# Patient Record
Sex: Male | Born: 1955 | Race: White | Hispanic: No | Marital: Married | Smoking: Former smoker
Health system: Southern US, Community
[De-identification: ages and names within clinical notes are randomized; demographics above are authoritative.]

## PROBLEM LIST (undated history)

## (undated) DIAGNOSIS — F418 Other specified anxiety disorders: Secondary | ICD-10-CM

## (undated) DIAGNOSIS — S069X9A Unspecified intracranial injury with loss of consciousness of unspecified duration, initial encounter: Secondary | ICD-10-CM

## (undated) DIAGNOSIS — C61 Malignant neoplasm of prostate: Secondary | ICD-10-CM

## (undated) DIAGNOSIS — S069XAA Unspecified intracranial injury with loss of consciousness status unknown, initial encounter: Secondary | ICD-10-CM

## (undated) DIAGNOSIS — I10 Essential (primary) hypertension: Secondary | ICD-10-CM

## (undated) DIAGNOSIS — F32A Depression, unspecified: Secondary | ICD-10-CM

## (undated) DIAGNOSIS — F329 Major depressive disorder, single episode, unspecified: Secondary | ICD-10-CM

## (undated) DIAGNOSIS — G9332 Myalgic encephalomyelitis/chronic fatigue syndrome: Secondary | ICD-10-CM

## (undated) DIAGNOSIS — Z8546 Personal history of malignant neoplasm of prostate: Secondary | ICD-10-CM

## (undated) DIAGNOSIS — R5382 Chronic fatigue, unspecified: Secondary | ICD-10-CM

## (undated) HISTORY — PX: CATARACT EXTRACTION: SUR2

## (undated) HISTORY — DX: Malignant neoplasm of prostate: C61

## (undated) HISTORY — DX: Other specified anxiety disorders: F41.8

## (undated) HISTORY — DX: Unspecified intracranial injury with loss of consciousness status unknown, initial encounter: S06.9XAA

## (undated) HISTORY — PX: EYE SURGERY: SHX253

## (undated) HISTORY — PX: COLONOSCOPY: SHX174

## (undated) HISTORY — DX: Unspecified intracranial injury with loss of consciousness of unspecified duration, initial encounter: S06.9X9A

## (undated) HISTORY — DX: Essential (primary) hypertension: I10

## (undated) HISTORY — DX: Depression, unspecified: F32.A

## (undated) HISTORY — DX: Personal history of malignant neoplasm of prostate: Z85.46

## (undated) HISTORY — PX: HX WISDOM TEETH EXTRACTION: SHX21

## (undated) HISTORY — PX: HX CATARACT REMOVAL: SHX102

## (undated) HISTORY — PX: HX TONSIL AND ADENOIDECTOMY: SHX28

## (undated) HISTORY — PX: HX TONSILLECTOMY: SHX27

## (undated) HISTORY — PX: HX NOSE/SINUS SURGERY: 2100001179

---
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
## 2005-03-15 ENCOUNTER — Emergency Department (HOSPITAL_COMMUNITY): Admission: EM | Admit: 2005-03-15 | Discharge: 2005-03-15 | Payer: Self-pay | Admitting: *Deleted

## 2005-03-17 ENCOUNTER — Emergency Department (HOSPITAL_COMMUNITY): Admission: EM | Admit: 2005-03-17 | Discharge: 2005-03-17 | Payer: Self-pay | Admitting: Emergency Medicine

## 2005-12-05 ENCOUNTER — Ambulatory Visit (HOSPITAL_COMMUNITY): Admission: RE | Admit: 2005-12-05 | Discharge: 2005-12-05 | Payer: Self-pay | Admitting: Family Medicine

## 2006-11-19 ENCOUNTER — Emergency Department (HOSPITAL_COMMUNITY): Admission: EM | Admit: 2006-11-19 | Discharge: 2006-11-19 | Payer: Self-pay | Admitting: Emergency Medicine

## 2007-06-27 IMAGING — CR DG ANKLE COMPLETE 3+V*R*
3 series · 3 of 3 positions shown · non-contrast
Comparison: none

HISTORY: Injury, fall, pain and swelling

RIGHT ANKLE 3 VIEWS:
Lateral soft tissue swelling.
Ankle mortise intact.
Amorphous calcification seen in soft tissues at medial ankle, question minimal
calcification within deltoid ligament.
No acute fracture, dislocation, or bone destruction.

[view not recorded (1 of 3)]
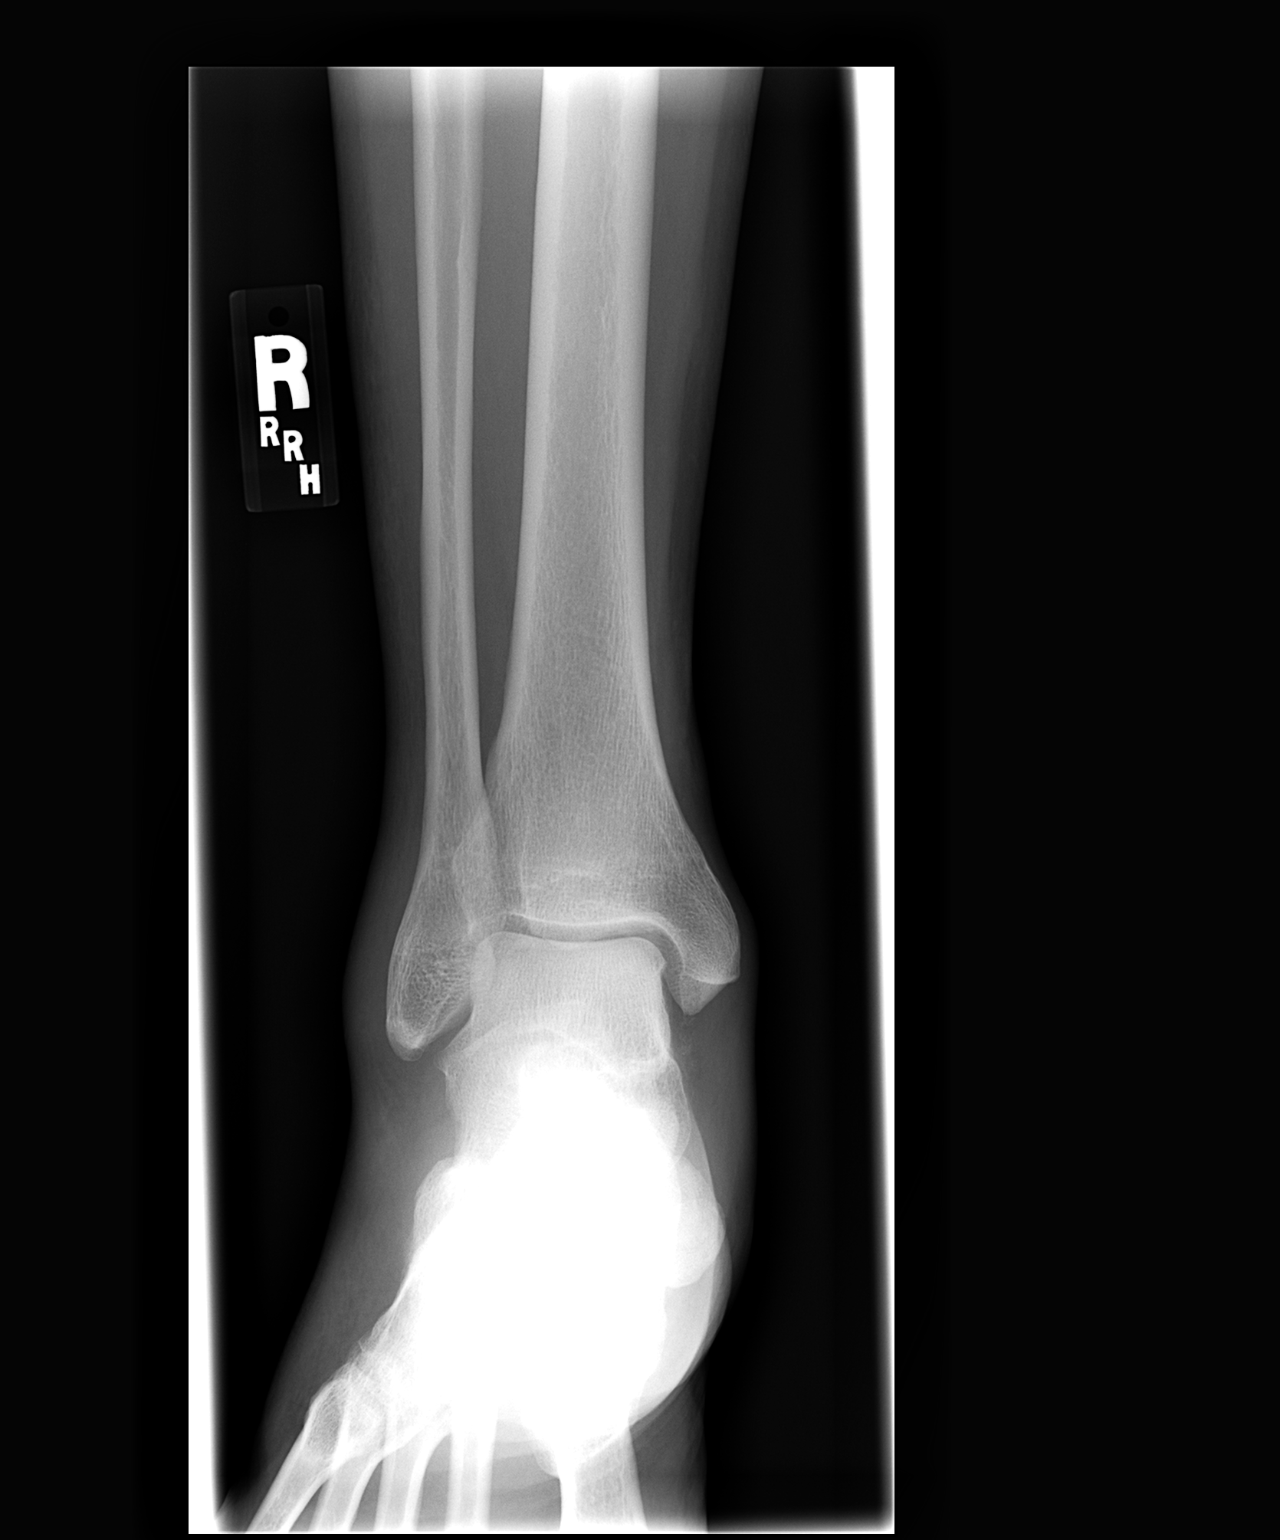

[view not recorded (2 of 3)]
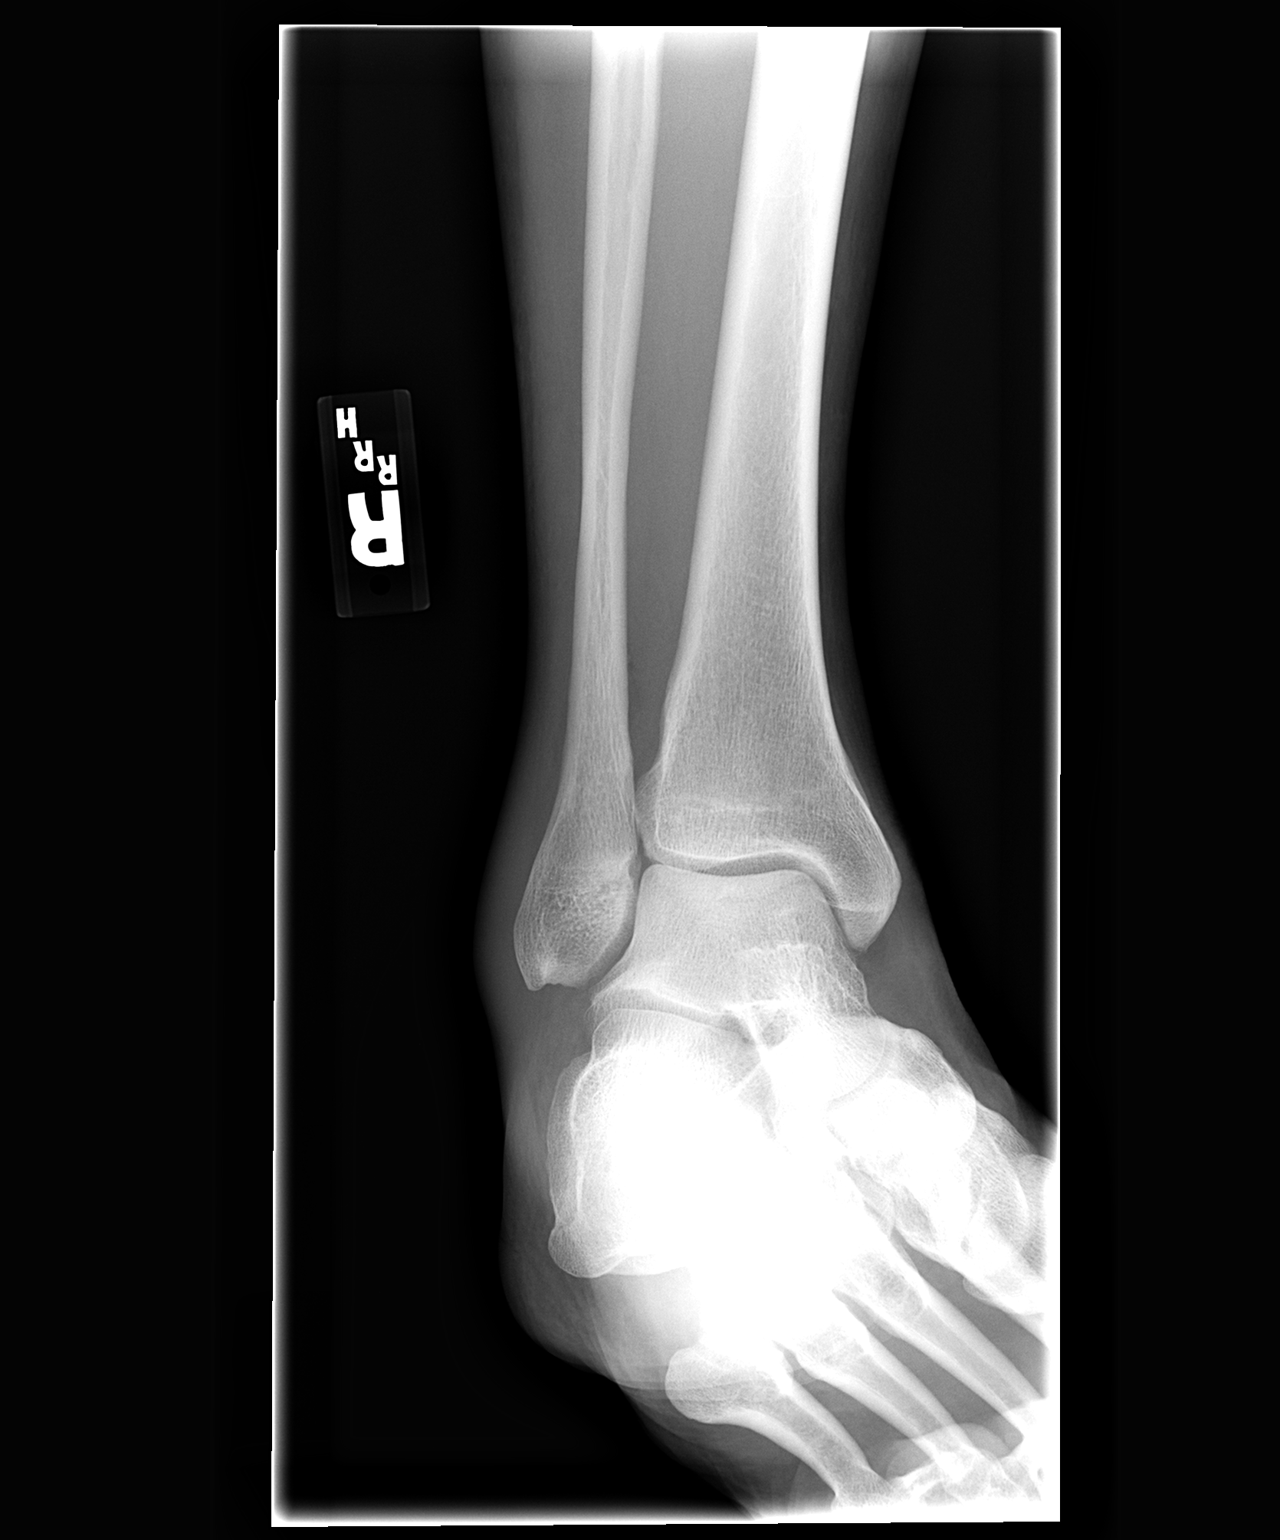

[view not recorded (3 of 3)]
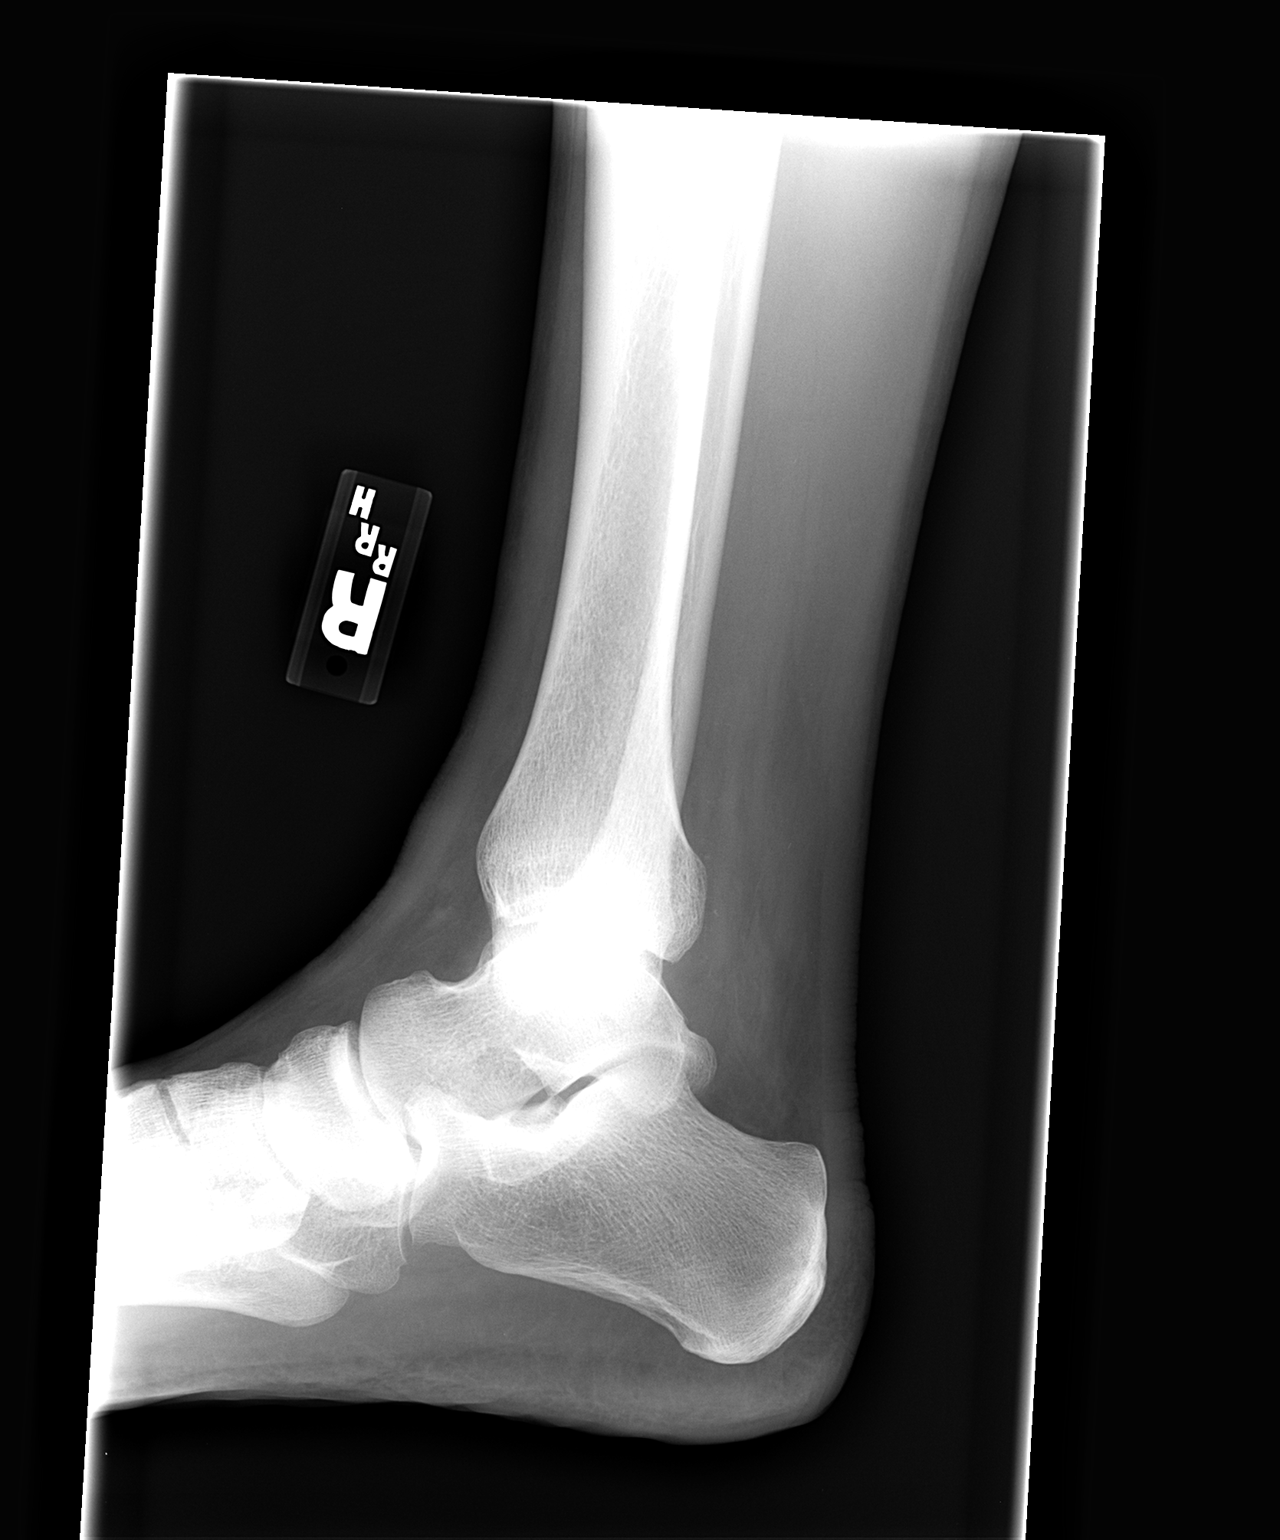

[3 of 3 positions shown; findings below may reference images not displayed]

IMPRESSION: No acute bony abnormalities.

## 2008-10-07 DIAGNOSIS — C61 Malignant neoplasm of prostate: Secondary | ICD-10-CM

## 2008-10-07 HISTORY — PX: PROSTATE SURGERY: SHX751

## 2008-10-07 HISTORY — PX: HX RADICAL PROSTATECTOMY: SHX70

## 2008-10-07 HISTORY — DX: Malignant neoplasm of prostate: C61

## 2015-07-31 ENCOUNTER — Ambulatory Visit (HOSPITAL_COMMUNITY): Payer: Self-pay | Admitting: Ophthalmology

## 2015-11-13 ENCOUNTER — Other Ambulatory Visit (INDEPENDENT_AMBULATORY_CARE_PROVIDER_SITE_OTHER): Payer: Self-pay

## 2015-11-13 MED ORDER — DESIPRAMINE 50 MG TABLET
50.00 mg | ORAL_TABLET | Freq: Every evening | ORAL | 0 refills | Status: DC
Start: 2015-11-13 — End: 2015-11-24

## 2015-11-13 NOTE — Telephone Encounter (Signed)
New patient needs medication prior to visit.  Please e-scribe and accept thanks. Seward Grater, LPN

## 2015-11-24 ENCOUNTER — Ambulatory Visit (INDEPENDENT_AMBULATORY_CARE_PROVIDER_SITE_OTHER): Payer: 59 | Admitting: Family Medicine

## 2015-11-24 ENCOUNTER — Encounter (INDEPENDENT_AMBULATORY_CARE_PROVIDER_SITE_OTHER): Payer: Self-pay | Admitting: Family Medicine

## 2015-11-24 VITALS — BP 150/98 | HR 112 | Temp 98.2°F | Resp 18 | Ht 73.0 in | Wt 184.0 lb

## 2015-11-24 DIAGNOSIS — N5231 Erectile dysfunction following radical prostatectomy: Principal | ICD-10-CM

## 2015-11-24 DIAGNOSIS — F32A Depression, unspecified: Secondary | ICD-10-CM

## 2015-11-24 DIAGNOSIS — F329 Major depressive disorder, single episode, unspecified: Secondary | ICD-10-CM | POA: Insufficient documentation

## 2015-11-24 DIAGNOSIS — N529 Male erectile dysfunction, unspecified: Secondary | ICD-10-CM | POA: Insufficient documentation

## 2015-11-24 DIAGNOSIS — Z8546 Personal history of malignant neoplasm of prostate: Secondary | ICD-10-CM

## 2015-11-24 DIAGNOSIS — L570 Actinic keratosis: Secondary | ICD-10-CM

## 2015-11-24 DIAGNOSIS — Z6824 Body mass index (BMI) 24.0-24.9, adult: Secondary | ICD-10-CM

## 2015-11-24 HISTORY — DX: Personal history of malignant neoplasm of prostate: Z85.46

## 2015-11-24 MED ORDER — FLUOROURACIL 5 % TOPICAL CREAM
TOPICAL_CREAM | Freq: Two times a day (BID) | CUTANEOUS | 1 refills | Status: AC
Start: 2015-11-24 — End: ?

## 2015-11-24 MED ORDER — DESIPRAMINE 50 MG TABLET
50.0000 mg | ORAL_TABLET | Freq: Every evening | ORAL | 4 refills | Status: DC
Start: 2015-11-24 — End: 2016-12-15

## 2015-11-24 MED ORDER — TRIMIX COMPOUNDED INTRACAVERNOSAL INJECTION
INJECTION | INTRACAVERNOUS | 3 refills | Status: AC
Start: 2015-11-24 — End: ?

## 2015-11-24 NOTE — Progress Notes (Signed)
[facility]  [address]  [place] [address]  Phone: [phone]  Fax: [phone]        Patient name:  Terry Reid  MRN:  [id number]  DOB:  [birth year]  DATE:  11/24/2015      Subjective:     Patient ID:  Terry Reid is an 60 y.o. male     Chief Complaint:    Chief Complaint   Patient presents with    Establish Care       HPI 60 y.o. to get established. Doing well, hx of depression since 60yo and doing well with med tx   Current Outpatient Prescriptions   Medication Sig    Alprostadil, papaverine & phentolamine (TRIMIX) Intracavernosal Injectable Compounded As directed    desipramine (NORPRAMIN) 50 mg Oral Tablet Take 1 Tab (50 mg total) by mouth Every night     Also ED following prostatectomy. Post 5 rys and released from urology for PSA annual followup. Using trmix prn for ED and asking for refill.  Depression has been in remission per pt.   PHQ Questionnaire  Little interest or pleasure in doing things.: Not at all  Feeling down, depressed, or hopeless: Not at all  PHQ 2 Total: 0    Hx of "white coat syndrome" BP " issues when comes to office, at home 115 per pt.    Review of Systems   Constitutional: Negative for fever.   HENT: Negative for congestion, hearing loss and tinnitus.    Eyes: Negative for visual disturbance.   Respiratory: Negative for cough, shortness of breath and wheezing.    Cardiovascular: Negative for chest pain, palpitations and leg swelling.   Gastrointestinal: Negative for abdominal pain, blood in stool, constipation, nausea and vomiting.        Rare indigestion once a month   Genitourinary: Negative for dysuria and hematuria.   Musculoskeletal: Positive for arthralgias (mild arthralgia on and off in arms. no significant stiffness or red joints.).   Skin: Negative for rash.   Neurological: Negative for dizziness, seizures and syncope.   Hematological: Negative for adenopathy.   Psychiatric/Behavioral: Negative for dysphoric mood. The patient is not nervous/anxious.           Past Medical History:   Diagnosis Date    Depression     H/O prostate cancer 11/24/2015    Robotic prostatectomy Nov 2010 [place]    Personal history of malignant neoplasm of prostate      Current Outpatient Prescriptions   Medication Sig    Alprostadil, papaverine & phentolamine (TRIMIX) Intracavernosal Injectable Compounded As directed    desipramine (NORPRAMIN) 50 mg Oral Tablet Take 1 Tab (50 mg total) by mouth Every night    fluorouracil (EFUDEX) 5 % Cream Apply topically Twice daily Apply to lesions as directed       There is no immunization history on file for this patient.  Past Surgical History:   Procedure Laterality Date    HX NOSE/SINUS SURGERY      HX RADICAL PROSTATECTOMY  2010    HX TONSIL AND ADENOIDECTOMY      HX TONSILLECTOMY      HX WISDOM TEETH EXTRACTION       Family History   Problem Relation Age of Onset    Cancer Mother      skin cancer    Heart Attack Mother     Cancer Father      bile duct cancer    Heart Attack Father  Alzheimer's/Dementia Maternal Aunt     Alzheimer's/Dementia Maternal Uncle     Alzheimer's/Dementia Paternal Aunt     Alzheimer's/Dementia Paternal Uncle      Social History     Social History    Marital status: Married     Spouse name: N/A    Number of children: N/A    Years of education: N/A     Occupational History    Not on file.     Social History Main Topics    Smoking status: Former Smoker     Packs/day: 0.50     Years: 10.00     Types: Cigarettes    Smokeless tobacco: Never Used    Alcohol use No    Drug use: No    Sexual activity: Yes     Other Topics Concern    Not on file     Social History Narrative    No narrative on file       Objective:     Visit Vitals    BP (!) 150/98    Pulse (!) 112    Temp 36.8 C (98.2 F) (Thermal Scan)    Resp 18    Ht 1.854 m (6\' 1" )    Wt 83.5 kg (184 lb)    SpO2 98%    BMI 24.28 kg/m2       Physical Exam   Constitutional: He appears well-developed. No distress.   HENT:   Right Ear: External ear  normal.   Left Ear: External ear normal.   Mouth/Throat: Oropharynx is clear and moist.   Eyes: Conjunctivae are normal. Pupils are equal, round, and reactive to light.   Neck: Normal range of motion. Neck supple.   Cardiovascular: Normal rate and regular rhythm.    No murmur heard.  Pulmonary/Chest: Effort normal and breath sounds normal. He has no wheezes. He exhibits no tenderness.   Abdominal: Soft. Bowel sounds are normal. There is no tenderness.   Musculoskeletal: Normal range of motion. He exhibits no edema.   Lymphadenopathy:     He has no cervical adenopathy.   Neurological: He is alert.   Skin: Skin is warm.   Psychiatric: He has a normal mood and affect. His behavior is normal. Judgment and thought content normal.        Ortho Exam    Assessment & Plan:       ICD-10-CM    1. Erectile dysfunction following radical prostatectomy N52.31    2. Body mass index (BMI) of 24.0 to 24.9 in adult Z68.24    3. H/O prostate cancer Z85.46    4. Depression, unspecified depression type F32.9    5. Actinic keratoses L57.0          Gave efudex for arms and hands and explained how to use it BID For 3 weeks.  White coat per pt, check BP next week with nurse. Tells me ECHO done last year, need copy and other labs from this year. Pt to obtain  Also had immunizations and to get me records  Refilled trimix and mood meds.  Annual labs next visit in one year IF BP stays down. Will determine at nurse visit.    Elevated Blood Pressure Plan of Care:  First hypertensive reading: Rescreen in 10 days and check bp at home      Colonoscopy done last year in [place] and to get me copy.      Dagoberto Reef, MD

## 2016-05-22 ENCOUNTER — Encounter (INDEPENDENT_AMBULATORY_CARE_PROVIDER_SITE_OTHER): Payer: Self-pay | Admitting: Family

## 2016-12-15 ENCOUNTER — Other Ambulatory Visit (INDEPENDENT_AMBULATORY_CARE_PROVIDER_SITE_OTHER): Payer: Self-pay | Admitting: Family Medicine

## 2016-12-15 NOTE — Telephone Encounter (Signed)
Please sign and e-prescribe Rx - thanks  Norpramine 50 mg  ONE MONTH ONLY - pt last seen over one year ago     Mailed appointment card for  December 31, 2016     Karie Chimera, [place]

## 2016-12-31 ENCOUNTER — Encounter (INDEPENDENT_AMBULATORY_CARE_PROVIDER_SITE_OTHER): Payer: 59 | Admitting: Family Medicine

## 2017-01-02 ENCOUNTER — Ambulatory Visit (INDEPENDENT_AMBULATORY_CARE_PROVIDER_SITE_OTHER): Payer: BC Managed Care – PPO | Admitting: Family Medicine

## 2017-01-02 ENCOUNTER — Encounter (INDEPENDENT_AMBULATORY_CARE_PROVIDER_SITE_OTHER): Payer: Self-pay | Admitting: Family Medicine

## 2017-01-02 VITALS — BP 140/88 | HR 100 | Temp 97.5°F | Resp 16 | Ht 73.0 in | Wt 191.9 lb

## 2017-01-02 DIAGNOSIS — E782 Mixed hyperlipidemia: Secondary | ICD-10-CM

## 2017-01-02 DIAGNOSIS — N529 Male erectile dysfunction, unspecified: Secondary | ICD-10-CM

## 2017-01-02 DIAGNOSIS — F32A Depression, unspecified: Secondary | ICD-10-CM

## 2017-01-02 DIAGNOSIS — Z6825 Body mass index (BMI) 25.0-25.9, adult: Secondary | ICD-10-CM

## 2017-01-02 DIAGNOSIS — Z139 Encounter for screening, unspecified: Secondary | ICD-10-CM

## 2017-01-02 DIAGNOSIS — F329 Major depressive disorder, single episode, unspecified: Secondary | ICD-10-CM

## 2017-01-02 MED ORDER — AVANAFIL 50 MG TABLET
50.0000 mg | ORAL_TABLET | Freq: Every day | ORAL | 2 refills | Status: AC
Start: 2017-01-02 — End: ?

## 2017-01-02 MED ORDER — SILDENAFIL (PULMONARY HYPERTENSION) 20 MG TABLET: 20 mg | Tab | Freq: Once | ORAL | 5 refills | 0 days | Status: AC | PRN

## 2017-01-02 NOTE — Progress Notes (Signed)
Chief Complaint   Patient presents with    Erectile Dysfunction    Follow Up Mood Check      61 year old for annual checkup.  He has had  History of prostate cancer prostatectomy near 8 years ago.  Since then he has battled ED.  The oral agents gave him a headache but  He sounds a been on higher dosages.He does get occasional spontaneous erections.  There insufficient for activity.  He was on Trimix  But we have been unable to get that compounded.   Denies any chest pain, shortness of breath, nausea or vomiting.  No rectal bleeding or hematuria.  No headaches.  He is fairly active and has no exertional chest pain.  Tells me he is taking his meds appropriately.  Voices no other complaints today.    PHQ Questionnaire  Little interest or pleasure in doing things.: Not at all  Feeling down, depressed, or hopeless: Not at all  Trouble falling or staying asleep, or sleeping too much.: Not at all  Feeling tired or having little energy: Not at all  Poor appetite or overeating: Not at all  Feeling bad about yourself/ that you are a failure in the past 2 weeks?: Not at all  Trouble concentrating on things in the past 2 weeks?: Not at all  Moving/Speaking slowly or being fidgety or restless  in the past 2 weeks?: Not at all  Thoughts that you would be better off DEAD, or of hurting yourself in some way.: Not at all  PHQ 9 Total: 0  Interpretation of Total Score: 0-4 No depression  If you checked off any problems, how difficult have these problems made it for you to do your work, take care of things at home, or get along with other people?: Not difficult at all    His mood is been stable.  He has had 3 episodes of significant depression over his life and has been maintained on TCA therapy since and doing well.  He would prefer to stay on that.  Past Medical History:   Diagnosis Date    Depression     H/O prostate cancer 11/24/2015    [place] prostate[place] Nov 2010 [place]    Personal history of malignant neoplasm of prostate          BP 140/88   Pulse 100   Temp 36.4 C (97.5 F) (Tympanic)    Resp 16   Ht 1.854 m (6\' 1" )   Wt 87 kg (191 lb 14.4 oz)   SpO2 95%   BMI 25.32 kg/m2    Alert and oriented.  HEENT was negative.  Neck is supple.  No bruits or adenopathy.  Lungs are clear in all fields.  Heart regular rate without murmurs.  Abdomen is soft and non-tender.  Rectal genital deferred.  Extremities no edema gait normal.        ICD-10-CM    1. Depression, unspecified depression type F32.9 THYROID STIMULATING HORMONE (SENSITIVE TSH)   2. BMI 25.0-25.9,adult Z68.25    3. Erectile dysfunction N52.9 COMPREHENSIVE METABOLIC PNL, FASTING     CBC   4. Screening for condition Z13.9 HEPATITIS C ANTIBODY     PSA, DIAGNOSTIC     HIV-1/2 ANTIBODY     LIPID PANEL     COMPREHENSIVE METABOLIC PNL, FASTING     CBC     THYROID STIMULATING HORMONE (SENSITIVE TSH)   5. Mixed hyperlipidemia E78.2 LIPID PANEL     Mood is stable will keep him  on his current   Desipramine therapy.  Offered to cut him back but at this point he would like to stay on the current dose. BMI addressed: Advised on diet, weight loss, and exercise to reduce above normal BMI.     In terms of his ED I offered to refer him to Urology.  I called pharmacy but were unable to get the Trimix compounded, which he has been on until he moved here.   Currently the cost of the prescription is prohibitive.  Going to try him on a low-dose generic sildenafil.  Told just try 20 mg and see if that gives him headache.  He can double it if needed.  If that does not work I gave him  A prescription for a few tablets of stendra to try.  We sign him up for my chart and he can send me a note let me know if either of these work.  If not I will refer him.  Order for labs, gave number for discount day if wants to save money.    This note/tempate was completed/augmented using MModal Fluency Direct. Errors in dictation may be present but the note (History, Physical, and plan) has been dictated/performed by myself.      Dagoberto Reef, MD   [facility]  01/02/2017 [phone]

## 2017-01-17 ENCOUNTER — Other Ambulatory Visit (INDEPENDENT_AMBULATORY_CARE_PROVIDER_SITE_OTHER): Payer: Self-pay | Admitting: Family Medicine

## 2017-01-17 NOTE — Telephone Encounter (Signed)
Please sign and except pending Rx, Crystalle Popwell, MA

## 2017-01-24 ENCOUNTER — Inpatient Hospital Stay (HOSPITAL_COMMUNITY): Payer: BC Managed Care – PPO

## 2017-01-24 ENCOUNTER — Inpatient Hospital Stay (HOSPITAL_COMMUNITY): Payer: Auto Insurance (includes no fault) | Admitting: Surgery

## 2017-01-24 ENCOUNTER — Emergency Department (EMERGENCY_DEPARTMENT_HOSPITAL): Payer: BC Managed Care – PPO

## 2017-01-24 ENCOUNTER — Inpatient Hospital Stay (HOSPITAL_COMMUNITY): Payer: BC Managed Care – PPO | Admitting: Radiology

## 2017-01-24 ENCOUNTER — Inpatient Hospital Stay
Admission: EM | Admit: 2017-01-24 | Discharge: 2017-02-12 | DRG: 957 | Disposition: A | Discharge status: Rehabilitation Facility | Payer: BC Managed Care – PPO | Attending: SURGICAL CRITICAL CARE | Admitting: SURGICAL CRITICAL CARE

## 2017-01-24 ENCOUNTER — Encounter (HOSPITAL_COMMUNITY)
Admission: EM | Disposition: A | Discharge status: Rehabilitation Facility | Payer: Self-pay | Source: Home / Self Care | Attending: Surgery

## 2017-01-24 ENCOUNTER — Inpatient Hospital Stay (HOSPITAL_COMMUNITY): Payer: BC Managed Care – PPO | Admitting: Certified Registered"

## 2017-01-24 ENCOUNTER — Encounter (HOSPITAL_COMMUNITY): Payer: Self-pay | Admitting: Family

## 2017-01-24 DIAGNOSIS — S06369A Traumatic hemorrhage of cerebrum, unspecified, with loss of consciousness of unspecified duration, initial encounter: Secondary | ICD-10-CM

## 2017-01-24 DIAGNOSIS — M7989 Other specified soft tissue disorders: Secondary | ICD-10-CM

## 2017-01-24 DIAGNOSIS — F329 Major depressive disorder, single episode, unspecified: Secondary | ICD-10-CM | POA: Diagnosis present

## 2017-01-24 DIAGNOSIS — S062X1A Diffuse traumatic brain injury with loss of consciousness of 30 minutes or less, initial encounter: Principal | ICD-10-CM | POA: Diagnosis present

## 2017-01-24 DIAGNOSIS — R402314 Coma scale, best motor response, none, 24 hours or more after hospital admission: Secondary | ICD-10-CM | POA: Diagnosis not present

## 2017-01-24 DIAGNOSIS — R402124 Coma scale, eyes open, to pain, 24 hours or more after hospital admission: Secondary | ICD-10-CM | POA: Diagnosis not present

## 2017-01-24 DIAGNOSIS — I619 Nontraumatic intracerebral hemorrhage, unspecified: Secondary | ICD-10-CM

## 2017-01-24 DIAGNOSIS — R55 Syncope and collapse: Secondary | ICD-10-CM

## 2017-01-24 DIAGNOSIS — S069X9A Unspecified intracranial injury with loss of consciousness of unspecified duration, initial encounter: Secondary | ICD-10-CM

## 2017-01-24 DIAGNOSIS — S81022A Laceration with foreign body, left knee, initial encounter: Secondary | ICD-10-CM

## 2017-01-24 DIAGNOSIS — S6992XA Unspecified injury of left wrist, hand and finger(s), initial encounter: Secondary | ICD-10-CM | POA: Diagnosis present

## 2017-01-24 DIAGNOSIS — T1490XA Injury, unspecified, initial encounter: Secondary | ICD-10-CM | POA: Diagnosis present

## 2017-01-24 DIAGNOSIS — S6991XA Unspecified injury of right wrist, hand and finger(s), initial encounter: Secondary | ICD-10-CM

## 2017-01-24 DIAGNOSIS — Z8546 Personal history of malignant neoplasm of prostate: Secondary | ICD-10-CM

## 2017-01-24 DIAGNOSIS — G908 Other disorders of autonomic nervous system: Secondary | ICD-10-CM | POA: Diagnosis not present

## 2017-01-24 DIAGNOSIS — R000 Tachycardia, unspecified: Secondary | ICD-10-CM

## 2017-01-24 DIAGNOSIS — S06351A Traumatic hemorrhage of left cerebrum with loss of consciousness of 30 minutes or less, initial encounter: Secondary | ICD-10-CM | POA: Diagnosis present

## 2017-01-24 DIAGNOSIS — S299XXA Unspecified injury of thorax, initial encounter: Secondary | ICD-10-CM

## 2017-01-24 DIAGNOSIS — R079 Chest pain, unspecified: Secondary | ICD-10-CM

## 2017-01-24 DIAGNOSIS — S298XXA Other specified injuries of thorax, initial encounter: Secondary | ICD-10-CM

## 2017-01-24 DIAGNOSIS — S06360A Traumatic hemorrhage of cerebrum, unspecified, without loss of consciousness, initial encounter: Secondary | ICD-10-CM | POA: Diagnosis present

## 2017-01-24 DIAGNOSIS — M25462 Effusion, left knee: Secondary | ICD-10-CM

## 2017-01-24 DIAGNOSIS — S81012A Laceration without foreign body, left knee, initial encounter: Secondary | ICD-10-CM

## 2017-01-24 DIAGNOSIS — Z8249 Family history of ischemic heart disease and other diseases of the circulatory system: Secondary | ICD-10-CM

## 2017-01-24 DIAGNOSIS — S61522A Laceration with foreign body of left wrist, initial encounter: Secondary | ICD-10-CM

## 2017-01-24 DIAGNOSIS — Z781 Physical restraint status: Secondary | ICD-10-CM

## 2017-01-24 DIAGNOSIS — R9431 Abnormal electrocardiogram [ECG] [EKG]: Secondary | ICD-10-CM

## 2017-01-24 DIAGNOSIS — S2239XA Fracture of one rib, unspecified side, initial encounter for closed fracture: Secondary | ICD-10-CM | POA: Diagnosis present

## 2017-01-24 DIAGNOSIS — S3991XA Unspecified injury of abdomen, initial encounter: Secondary | ICD-10-CM

## 2017-01-24 DIAGNOSIS — S3993XA Unspecified injury of pelvis, initial encounter: Secondary | ICD-10-CM

## 2017-01-24 DIAGNOSIS — S2242XA Multiple fractures of ribs, left side, initial encounter for closed fracture: Secondary | ICD-10-CM

## 2017-01-24 DIAGNOSIS — S8992XA Unspecified injury of left lower leg, initial encounter: Secondary | ICD-10-CM

## 2017-01-24 DIAGNOSIS — G8191 Hemiplegia, unspecified affecting right dominant side: Secondary | ICD-10-CM | POA: Diagnosis not present

## 2017-01-24 DIAGNOSIS — S79922A Unspecified injury of left thigh, initial encounter: Secondary | ICD-10-CM

## 2017-01-24 DIAGNOSIS — S0633AA Contusion and laceration of cerebrum, unspecified, with loss of consciousness status unknown, initial encounter: Secondary | ICD-10-CM | POA: Diagnosis present

## 2017-01-24 DIAGNOSIS — M5030 Other cervical disc degeneration, unspecified cervical region: Secondary | ICD-10-CM

## 2017-01-24 DIAGNOSIS — T83098A Other mechanical complication of other indwelling urethral catheter, initial encounter: Secondary | ICD-10-CM

## 2017-01-24 DIAGNOSIS — I615 Nontraumatic intracerebral hemorrhage, intraventricular: Secondary | ICD-10-CM | POA: Diagnosis present

## 2017-01-24 DIAGNOSIS — S062X9A Diffuse traumatic brain injury with loss of consciousness of unspecified duration, initial encounter: Secondary | ICD-10-CM | POA: Diagnosis present

## 2017-01-24 DIAGNOSIS — S27321A Contusion of lung, unilateral, initial encounter: Secondary | ICD-10-CM | POA: Diagnosis present

## 2017-01-24 DIAGNOSIS — I10 Essential (primary) hypertension: Secondary | ICD-10-CM | POA: Diagnosis not present

## 2017-01-24 DIAGNOSIS — Z09 Encounter for follow-up examination after completed treatment for conditions other than malignant neoplasm: Secondary | ICD-10-CM

## 2017-01-24 DIAGNOSIS — R402224 Coma scale, best verbal response, incomprehensible words, 24 hours or more after hospital admission: Secondary | ICD-10-CM | POA: Diagnosis not present

## 2017-01-24 DIAGNOSIS — Z23 Encounter for immunization: Secondary | ICD-10-CM

## 2017-01-24 DIAGNOSIS — J690 Pneumonitis due to inhalation of food and vomit: Secondary | ICD-10-CM | POA: Diagnosis not present

## 2017-01-24 DIAGNOSIS — S2243XA Multiple fractures of ribs, bilateral, initial encounter for closed fracture: Secondary | ICD-10-CM

## 2017-01-24 DIAGNOSIS — I499 Cardiac arrhythmia, unspecified: Secondary | ICD-10-CM

## 2017-01-24 DIAGNOSIS — S59911A Unspecified injury of right forearm, initial encounter: Secondary | ICD-10-CM

## 2017-01-24 DIAGNOSIS — G909 Disorder of the autonomic nervous system, unspecified: Secondary | ICD-10-CM | POA: Diagnosis not present

## 2017-01-24 DIAGNOSIS — S61532A Puncture wound without foreign body of left wrist, initial encounter: Secondary | ICD-10-CM | POA: Diagnosis present

## 2017-01-24 DIAGNOSIS — I4510 Unspecified right bundle-branch block: Secondary | ICD-10-CM

## 2017-01-24 DIAGNOSIS — R339 Retention of urine, unspecified: Secondary | ICD-10-CM | POA: Diagnosis not present

## 2017-01-24 DIAGNOSIS — S0990XA Unspecified injury of head, initial encounter: Secondary | ICD-10-CM

## 2017-01-24 DIAGNOSIS — S2249XA Multiple fractures of ribs, unspecified side, initial encounter for closed fracture: Secondary | ICD-10-CM | POA: Diagnosis present

## 2017-01-24 DIAGNOSIS — Z452 Encounter for adjustment and management of vascular access device: Secondary | ICD-10-CM

## 2017-01-24 DIAGNOSIS — Z4682 Encounter for fitting and adjustment of non-vascular catheter: Secondary | ICD-10-CM

## 2017-01-24 DIAGNOSIS — N35010 Post-traumatic urethral stricture, male, meatal: Secondary | ICD-10-CM

## 2017-01-24 DIAGNOSIS — S59912A Unspecified injury of left forearm, initial encounter: Secondary | ICD-10-CM

## 2017-01-24 DIAGNOSIS — F419 Anxiety disorder, unspecified: Secondary | ICD-10-CM | POA: Diagnosis present

## 2017-01-24 DIAGNOSIS — S199XXA Unspecified injury of neck, initial encounter: Secondary | ICD-10-CM

## 2017-01-24 DIAGNOSIS — N320 Bladder-neck obstruction: Secondary | ICD-10-CM | POA: Diagnosis present

## 2017-01-24 DIAGNOSIS — S062XAA Diffuse traumatic brain injury with loss of consciousness status unknown, initial encounter: Secondary | ICD-10-CM | POA: Diagnosis present

## 2017-01-24 HISTORY — DX: Myalgic encephalomyelitis/chronic fatigue syndrome: G93.32

## 2017-01-24 HISTORY — DX: Chronic fatigue, unspecified: R53.82

## 2017-01-24 LAB — CBC
HCT: 45.8 % (ref 36.7–47.0)
HCT: 38.8 % (ref 36.7–47.0)
HGB: 15.5 g/dL (ref 12.5–16.3)
HGB: 13.2 g/dL (ref 12.5–16.3)
MCH: 30.9 pg (ref 27.4–33.0)
MCH: 30.8 pg (ref 27.4–33.0)
MCHC: 33.9 g/dL (ref 32.5–35.8)
MCHC: 34 g/dL (ref 32.5–35.8)
MCV: 91.2 fL (ref 78.0–100.0)
MCV: 90.6 fL (ref 78.0–100.0)
MPV: 7.1 fL — ABNORMAL LOW (ref 7.5–11.5)
MPV: 7 fL — ABNORMAL LOW (ref 7.5–11.5)
PLATELETS: 429 x10?3/uL (ref 140–450)
PLATELETS: 317 x10ˆ3/uL (ref 140–450)
RBC: 5.03 x10?6/uL (ref 4.06–5.63)
RBC: 4.29 x10ˆ6/uL (ref 4.06–5.63)
RDW: 12.7 % (ref 12.0–15.0)
RDW: 12.3 % (ref 12.0–15.0)
WBC: 37 x10ˆ3/uL — ABNORMAL HIGH (ref 3.5–11.0)
WBC: 27.1 x10ˆ3/uL — ABNORMAL HIGH (ref 3.5–11.0)

## 2017-01-24 LAB — DRUG SCREEN, NO CONFIRMATION, URINE
AMPHETAMINES URINE: NEGATIVE
BARBITURATES URINE: NEGATIVE
BENZODIAZEPINES URINE: NEGATIVE
BUPRENORPHINE URINE: NEGATIVE
CANNABINOIDS URINE: NEGATIVE
COCAINE METABOLITES URINE: NEGATIVE
CREATININE RANDOM URINE: 28 mg/dL
ECSTASY/MDMA URINE: NEGATIVE
METHADONE URINE: NEGATIVE
OPIATES URINE (LOW CUTOFF): NEGATIVE
OXYCODONE URINE: NEGATIVE

## 2017-01-24 LAB — VENOUS BLOOD GAS/LACTATE/CO-OX/LYTES (NA/K/CA/CL/GLUC) - ORS ONLY
BASE DEFICIT: 2.5 mmol/L (ref ?–3.0)
BICARBONATE (VENOUS): 22.1 mmol/L (ref 22.0–26.0)
CARBOXYHEMOGLOBIN: 1.8 % (ref 0.0–2.5)
CHLORIDE: 103 mmol/L (ref 96–111)
GLUCOSE: 198 mg/dL — ABNORMAL HIGH (ref 60–105)
HEMOGLOBIN: 16.7 g/dL (ref 12.0–18.0)
IONIZED CALCIUM: 1.22 mmol/L (ref 1.10–1.36)
LACTATE: 2.9 mmol/L — ABNORMAL HIGH (ref 0.0–1.3)
MET-HEMOGLOBIN: 2 % (ref 0.0–3.5)
O2CT: 16.7 % (ref 6.7–17.5)
OXYHEMOGLOBIN: 71.4 % (ref 40.0–80.0)
PCO2 (VENOUS): 45 mmHg (ref 41.00–51.00)
PH (VENOUS): 7.33 (ref 7.31–7.41)
PO2 (VENOUS): 36 mmHg (ref 35.0–50.0)
SODIUM: 133 mmol/L — ABNORMAL LOW (ref 136–145)
WHOLE BLOOD POTASSIUM: 5 mmol/L (ref 3.5–5.0)

## 2017-01-24 LAB — POC BLOOD GLUCOSE (RESULTS)
GLUCOSE, POC: 134 mg/dL — ABNORMAL HIGH (ref 70–105)
GLUCOSE, POC: 107 mg/dL — ABNORMAL HIGH (ref 70–105)

## 2017-01-24 LAB — BASIC METABOLIC PANEL
ANION GAP: 7 mmol/L (ref 4–13)
ANION GAP: 7 mmol/L (ref 4–13)
BUN/CREA RATIO: 18 (ref 6–22)
BUN/CREA RATIO: 17 (ref 6–22)
BUN: 14 mg/dL (ref 8–25)
BUN: 14 mg/dL (ref 8–25)
CALCIUM: 9 mg/dL (ref 8.5–10.2)
CALCIUM: 8.2 mg/dL — ABNORMAL LOW (ref 8.5–10.2)
CHLORIDE: 114 mmol/L — ABNORMAL HIGH (ref 96–111)
CHLORIDE: 114 mmol/L — ABNORMAL HIGH (ref 96–111)
CO2 TOTAL: 22 mmol/L (ref 22–32)
CO2 TOTAL: 22 mmol/L (ref 22–32)
CREATININE: 0.79 mg/dL (ref 0.62–1.27)
CREATININE: 0.82 mg/dL (ref 0.62–1.27)
ESTIMATED GFR: >59 mL/min/1.73mˆ2 (ref >59)
ESTIMATED GFR: >59 mL/min/1.73mˆ2 (ref >59)
GLUCOSE: 137 mg/dL (ref 65–139)
GLUCOSE: 97 mg/dL (ref 65–139)
POTASSIUM: 4.5 mmol/L (ref 3.5–5.1)
POTASSIUM: 4.8 mmol/L (ref 3.5–5.1)
SODIUM: 143 mmol/L (ref 136–145)
SODIUM: 143 mmol/L (ref 136–145)

## 2017-01-24 LAB — CBC WITH DIFF
BASOPHIL #: 0.14 x10ˆ3/uL (ref 0.00–0.20)
BASOPHIL %: 1 %
EOSINOPHIL #: 0.04 x10ˆ3/uL (ref 0.00–0.50)
EOSINOPHIL %: 0 %
HCT: 48.8 % — ABNORMAL HIGH (ref 36.7–47.0)
HGB: 16.7 g/dL — ABNORMAL HIGH (ref 12.5–16.3)
LYMPHOCYTE #: 2.42 x10ˆ3/uL (ref 1.00–4.80)
LYMPHOCYTE %: 16 %
MCH: 31 pg (ref 27.4–33.0)
MCHC: 34.1 g/dL (ref 32.5–35.8)
MCV: 90.9 fL (ref 78.0–100.0)
MONOCYTE #: 0.9 10*3/uL (ref 0.30–1.00)
MONOCYTE #: 0.9 x10ˆ3/uL (ref 0.30–1.00)
MONOCYTE %: 6 %
MPV: 7.2 fL — ABNORMAL LOW (ref 7.5–11.5)
NEUTROPHIL #: 11.48 x10ˆ3/uL — ABNORMAL HIGH (ref 1.50–7.70)
NEUTROPHIL %: 77 %
PLATELETS: 441 x10ˆ3/uL (ref 140–450)
RBC: 5.37 x10ˆ6/uL (ref 4.06–5.63)
RDW: 12.5 % (ref 12.0–15.0)
WBC: 15 x10ˆ3/uL — ABNORMAL HIGH (ref 3.5–11.0)

## 2017-01-24 LAB — ARTERIAL BLOOD GAS/LACTATE/CO-OX/LYTES (NA/K/CA/CL/GLUC) - ORS ONLY
%FIO2 (ARTERIAL): 50 %
BASE DEFICIT: 3.3 mmol/L — ABNORMAL HIGH (ref 0.0–3.0)
BASE DEFICIT: 3.2 mmol/L — ABNORMAL HIGH (ref 0.0–3.0)
BICARBONATE (ARTERIAL): 22.3 mmol/L (ref 18.0–26.0)
BICARBONATE (ARTERIAL): 22.4 mmol/L (ref 18.0–26.0)
CARBOXYHEMOGLOBIN: 2.6 % — ABNORMAL HIGH (ref 0.0–2.5)
CARBOXYHEMOGLOBIN: 2.7 % — ABNORMAL HIGH (ref 0.0–2.5)
CHLORIDE: 112 mmol/L — ABNORMAL HIGH (ref 96–111)
CHLORIDE: 111 mmol/L (ref 96–111)
GLUCOSE: 124 mg/dL — ABNORMAL HIGH (ref 60–105)
GLUCOSE: 162 mg/dL — ABNORMAL HIGH (ref 60–105)
HEMOGLOBIN: 13.5 g/dL (ref 12.0–18.0)
HEMOGLOBIN: 13.6 g/dL (ref 12.0–18.0)
IONIZED CALCIUM: 1.18 mmol/L (ref 1.10–1.30)
IONIZED CALCIUM: 1.19 mmol/L (ref 1.10–1.30)
LACTATE: 2.2 mmol/L — ABNORMAL HIGH (ref 0.0–1.3)
LACTATE: 2.7 mmol/L — ABNORMAL HIGH (ref 0.0–1.3)
MET-HEMOGLOBIN: 2.4 % (ref 0.0–3.5)
MET-HEMOGLOBIN: 2.3 % (ref 0.0–3.5)
O2CT: 18 % (ref 15.7–24.3)
O2CT: 18.5 % (ref 15.7–24.3)
OXYHEMOGLOBIN: 94.4 % (ref 85.0–98.0)
OXYHEMOGLOBIN: 95.9 % (ref 85.0–98.0)
PAO2/FIO2 RATIO: 168 (ref <=200)
PCO2 (ARTERIAL): 40 mmHg (ref 35.0–45.0)
PCO2 (ARTERIAL): 43 mmHg (ref 35.0–45.0)
PH (ARTERIAL): 7.35 (ref 7.35–7.45)
PH (ARTERIAL): 7.33 — ABNORMAL LOW (ref 7.35–7.45)
PO2 (ARTERIAL): 84 mmHg (ref 72.0–100.0)
PO2 (ARTERIAL): 124 mmHg — ABNORMAL HIGH (ref 72.0–100.0)
SODIUM: 142 mmol/L (ref 136–145)
SODIUM: 142 mmol/L (ref 136–145)
WHOLE BLOOD POTASSIUM: 4.1 mmol/L (ref 3.5–5.0)
WHOLE BLOOD POTASSIUM: 4.2 mmol/L (ref 3.5–5.0)

## 2017-01-24 LAB — URINALYSIS, MACRO/MICRO
BILIRUBIN: NEGATIVE mg/dL
BLOOD: NEGATIVE mg/dL
COLOR: NORMAL
GLUCOSE: NEGATIVE mg/dL
KETONES: NEGATIVE mg/dL
LEUKOCYTES: NEGATIVE WBCs/uL
NITRITE: NEGATIVE
PH: 5 (ref 5.0–8.0)
PROTEIN: NEGATIVE mg/dL
SPECIFIC GRAVITY: 1.033 — ABNORMAL HIGH (ref 1.005–1.030)
UROBILINOGEN: NEGATIVE mg/dL

## 2017-01-24 LAB — ECG 12-LEAD
Atrial Rate: 106 {beats}/min
Calculated P Axis: 76 degrees
Calculated R Axis: 75 degrees
Calculated T Axis: 78 degrees
PR Interval: 172 ms
QRS Duration: 94 ms
QT Interval: 370 ms
QTC Calculation: 491 ms
Ventricular rate: 106 {beats}/min

## 2017-01-24 LAB — VENOUS BLOOD GAS/LACTATE
%FIO2 (VENOUS): 50 %
BASE DEFICIT: 4.3 mmol/L — ABNORMAL HIGH (ref ?–3.0)
BICARBONATE (VENOUS): 21 mmol/L — ABNORMAL LOW (ref 22.0–26.0)
LACTATE: 2.1 mmol/L — ABNORMAL HIGH (ref 0.0–1.3)
PCO2 (VENOUS): 52 mmHg — ABNORMAL HIGH (ref 41.00–51.00)
PH (VENOUS): 7.26 — ABNORMAL LOW (ref 7.31–7.41)
PO2 (VENOUS): 47 mmHg (ref 35.0–50.0)

## 2017-01-24 LAB — SODIUM: SODIUM: 134 mmol/L — ABNORMAL LOW (ref 136–145)

## 2017-01-24 LAB — ARTERIAL BLOOD GAS/LACTATE/CO-OX/LYTES (NA/K/CA/CL/GLUC)
%FIO2 (ARTERIAL): 50 %
PAO2/FIO2 RATIO: 248 (ref <=200)

## 2017-01-24 LAB — TYPE AND SCREEN
ABO/RH(D): O POS
ANTIBODY SCREEN: NEGATIVE
UNITS ORDERED: 2

## 2017-01-24 LAB — MAGNESIUM
MAGNESIUM: 1.8 mg/dL (ref 1.6–2.5)
MAGNESIUM: 1.8 mg/dL (ref 1.6–2.5)

## 2017-01-24 LAB — PHOSPHORUS
PHOSPHORUS: 2.5 mg/dL (ref 2.3–4.0)
PHOSPHORUS: 2.7 mg/dL (ref 2.3–4.0)

## 2017-01-24 LAB — BUN: BUN: 16 mg/dL (ref 8–25)

## 2017-01-24 LAB — VENOUS BLOOD GAS/LACTATE/CO-OX/LYTES (NA/K/CA/CL/GLUC)
%FIO2 (VENOUS): 21 %
GLUCOSE: 198 mg/dL — ABNORMAL HIGH (ref 60–105)
HEMOGLOBIN: 16.7 g/dL (ref 12.0–18.0)

## 2017-01-24 LAB — H & H
HCT: 39.2 % (ref 36.7–47.0)
HCT: 39.6 % (ref 36.7–47.0)
HGB: 13.4 g/dL (ref 12.5–16.3)
HGB: 13.6 g/dL (ref 12.5–16.3)

## 2017-01-24 LAB — CREATININE WITH EGFR
CREATININE: 0.89 mg/dL (ref 0.62–1.27)
ESTIMATED GFR: >59 mL/min/1.73mˆ2 (ref >59)

## 2017-01-24 LAB — ETHANOL, SERUM: ETHANOL: NOT DETECTED

## 2017-01-24 LAB — PTT (PARTIAL THROMBOPLASTIN TIME)
APTT: 26.9 s (ref 25.1–36.5)
APTT: 24.6 s — ABNORMAL LOW (ref 25.1–36.5)

## 2017-01-24 LAB — PT/INR
INR: 1.01 (ref 0.80–1.20)
INR: 1.03 (ref 0.80–1.20)
PROTHROMBIN TIME: 11.7 s (ref 9.3–13.9)
PROTHROMBIN TIME: 12 s (ref 9.3–13.9)

## 2017-01-24 LAB — ETHANOL, SERUM/PLASMA: ETHANOL: <10 mg/dL (ref <10)

## 2017-01-24 SURGERY — IRRIGATION AND DEBRIDEMENT KNEE
Anesthesia: General | Laterality: Left | Wound class: Dirty or Infected Wounds-Include old traumatic wounds

## 2017-01-24 MED ORDER — FENTANYL (PF) 50 MCG/ML INTRAVENOUS SOLUTION
0.2000 ug/kg/h | INTRAVENOUS | Status: DC
Start: 2017-01-24 — End: 2017-01-26
  Administered 2017-01-24: 1.8 ug/kg/h via INTRAVENOUS
  Administered 2017-01-24: 1.6 ug/kg/h via INTRAVENOUS
  Administered 2017-01-24: 2 ug/kg/h via INTRAVENOUS
  Administered 2017-01-24: 1.4 ug/kg/h via INTRAVENOUS
  Administered 2017-01-24: 2 ug/kg/h via INTRAVENOUS
  Administered 2017-01-24: 1.4 ug/kg/h via INTRAVENOUS
  Administered 2017-01-24 (×2): 1.6 ug/kg/h via INTRAVENOUS
  Administered 2017-01-24 – 2017-01-25 (×2): 1.4 ug/kg/h via INTRAVENOUS
  Administered 2017-01-25: 1 ug/kg/h via INTRAVENOUS
  Administered 2017-01-25 (×2): 0 ug/kg/h via INTRAVENOUS
  Administered 2017-01-25: 1.2 ug/kg/h via INTRAVENOUS
  Administered 2017-01-25: 1 ug/kg/h via INTRAVENOUS
  Administered 2017-01-25: 0.8 ug/kg/h via INTRAVENOUS
  Administered 2017-01-25: 1.2 ug/kg/h via INTRAVENOUS
  Filled 2017-01-24: qty 50

## 2017-01-24 MED ORDER — SENNA LEAF EXTRACT 176 MG/5 ML ORAL SYRUP
5.0000 mL | ORAL_SOLUTION | Freq: Two times a day (BID) | ORAL | Status: DC
Start: 2017-01-24 — End: 2017-01-31
  Administered 2017-01-24: 176 mg via GASTROSTOMY
  Administered 2017-01-24: 0 mg via GASTROSTOMY
  Administered 2017-01-25 – 2017-01-30 (×12): 176 mg via GASTROSTOMY
  Administered 2017-01-31: 0 mg via GASTROSTOMY
  Filled 2017-01-24 (×16): qty 15

## 2017-01-24 MED ORDER — SODIUM CHLORIDE 0.9 % (FLUSH) INJECTION SYRINGE
2.0000 mL | INJECTION | INTRAMUSCULAR | Status: DC | PRN
Start: 2017-01-24 — End: 2017-02-12

## 2017-01-24 MED ORDER — ETOMIDATE 2 MG/ML INTRAVENOUS SYRINGE
INJECTION | Freq: Once | INTRAVENOUS | Status: AC | PRN
Start: 2017-01-24 — End: 2017-01-24
  Administered 2017-01-24: 30 mg via INTRAVENOUS

## 2017-01-24 MED ORDER — FENTANYL (PF) 50 MCG/ML INJECTION SOLUTION
INTRAMUSCULAR | Status: AC
Start: 2017-01-24 — End: 2017-01-24
  Filled 2017-01-24: qty 4

## 2017-01-24 MED ORDER — METHYLENE BLUE (ANTIDOTE) 1 % (10 MG/ML) INTRAVENOUS SOLUTION
Freq: Once | INTRAVENOUS | Status: DC | PRN
Start: 2017-01-24 — End: 2017-01-24
  Filled 2017-01-24: qty 1

## 2017-01-24 MED ORDER — MAGNESIUM HYDROXIDE 400 MG/5 ML ORAL SUSPENSION
15.0000 mL | ORAL | Status: DC
Start: 2017-01-27 — End: 2017-01-27
  Administered 2017-01-27 (×2): 0 mg via ORAL

## 2017-01-24 MED ORDER — ROCURONIUM 10 MG/ML INTRAVENOUS SOLUTION
Freq: Once | INTRAVENOUS | Status: AC | PRN
Start: 2017-01-24 — End: 2017-01-24
  Administered 2017-01-24: 100 mg via INTRAVENOUS

## 2017-01-24 MED ORDER — DEXTROSE 5 % IN WATER (D5W) INTRAVENOUS SOLUTION
2.00 g | Freq: Once | INTRAVENOUS | Status: AC
Start: 2017-01-24 — End: 2017-01-24
  Administered 2017-01-24: 0 g via INTRAVENOUS
  Administered 2017-01-24: 2 g via INTRAVENOUS

## 2017-01-24 MED ORDER — FENTANYL (PF) 50 MCG/ML INTRAVENOUS SOLUTION
INTRAVENOUS | Status: AC
Start: 2017-01-24 — End: 2017-01-24
  Filled 2017-01-24: qty 50

## 2017-01-24 MED ORDER — ALBUMIN, HUMAN 5 % INTRAVENOUS SOLUTION
INTRAVENOUS | Status: DC | PRN
Start: 2017-01-24 — End: 2017-01-24

## 2017-01-24 MED ORDER — NEOSTIGMINE METHYLSULFATE 1 MG/ML INTRAVENOUS SOLUTION
Freq: Once | INTRAVENOUS | Status: DC | PRN
Start: 2017-01-24 — End: 2017-01-24
  Administered 2017-01-24: 3 mg via INTRAVENOUS

## 2017-01-24 MED ORDER — ELECTROLYTE-A INTRAVENOUS SOLUTION
INTRAVENOUS | Status: DC
Start: 2017-01-24 — End: 2017-01-24

## 2017-01-24 MED ORDER — IOPAMIDOL 300 MG IODINE/ML (61 %) INTRAVENOUS SOLUTION
100.00 mL | INTRAVENOUS | Status: AC
Start: 2017-01-24 — End: 2017-01-24
  Administered 2017-01-24 (×2): 100 mL via INTRAVENOUS

## 2017-01-24 MED ORDER — SODIUM CHLORIDE 0.9 % (FLUSH) INJECTION SYRINGE
20.00 mL | INJECTION | INTRAMUSCULAR | Status: DC | PRN
Start: 2017-01-24 — End: 2017-01-29
  Administered 2017-01-26 (×2): 26 mL via INTRAVENOUS
  Filled 2017-01-24: qty 30

## 2017-01-24 MED ORDER — SODIUM CHLORIDE 0.9 % IRRIGATION SOLUTION
3000.0000 mL | Status: DC | PRN
Start: 2017-01-24 — End: 2017-01-27
  Administered 2017-01-24: 3000 mL

## 2017-01-24 MED ORDER — CEFAZOLIN 10 GRAM SOLUTION FOR INJECTION
2.0000 g | Freq: Once | INTRAMUSCULAR | Status: AC
Start: 2017-01-24 — End: 2017-01-24
  Administered 2017-01-24: 2 g via INTRAVENOUS

## 2017-01-24 MED ORDER — MIDAZOLAM 1 MG/ML INJECTION SOLUTION
Freq: Once | INTRAMUSCULAR | Status: DC | PRN
Start: 2017-01-24 — End: 2017-01-24

## 2017-01-24 MED ORDER — SODIUM CHLORIDE 0.9 % IRRIGATION SOLUTION
1000.0000 mL | Status: DC | PRN
Start: 2017-01-24 — End: 2017-01-24

## 2017-01-24 MED ORDER — SODIUM CHLORIDE 0.9 % IRRIGATION SOLUTION
3000.0000 mL | Status: DC | PRN
Start: 2017-01-24 — End: 2017-01-24

## 2017-01-24 MED ORDER — PROPOFOL 10 MG/ML IV BOLUS
INJECTION | Freq: Once | INTRAVENOUS | Status: DC | PRN
Start: 2017-01-24 — End: 2017-01-24
  Administered 2017-01-24: 100 mg via INTRAVENOUS

## 2017-01-24 MED ORDER — GLYCOPYRROLATE 0.2 MG/ML INJECTION SOLUTION
Freq: Once | INTRAMUSCULAR | Status: DC | PRN
Start: 2017-01-24 — End: 2017-01-24
  Administered 2017-01-24: 0.6 mg via INTRAVENOUS

## 2017-01-24 MED ORDER — SODIUM CHLORIDE 0.9 % (FLUSH) INJECTION SYRINGE
10.00 mL | INJECTION | Freq: Three times a day (TID) | INTRAMUSCULAR | Status: DC
Start: 2017-01-24 — End: 2017-01-29
  Administered 2017-01-24: 0 mL via INTRAVENOUS
  Administered 2017-01-24 – 2017-01-25 (×2): 10 mL via INTRAVENOUS
  Administered 2017-01-25: 0 mL via INTRAVENOUS
  Administered 2017-01-25: 22 mL via INTRAVENOUS
  Administered 2017-01-26 (×2): 0 mL via INTRAVENOUS
  Administered 2017-01-26 – 2017-01-27 (×2): 10 mL via INTRAVENOUS
  Administered 2017-01-27: 36 mL via INTRAVENOUS
  Administered 2017-01-27: 22 mL via INTRAVENOUS
  Administered 2017-01-28: 0 mL via INTRAVENOUS
  Administered 2017-01-28 (×2): 10 mL via INTRAVENOUS
  Administered 2017-01-29: 0 mL via INTRAVENOUS

## 2017-01-24 MED ORDER — LACTATED RINGERS INTRAVENOUS SOLUTION
INTRAVENOUS | Status: DC | PRN
Start: 2017-01-24 — End: 2017-01-24

## 2017-01-24 MED ORDER — CHLORHEXIDINE GLUCONATE 0.12 % MOUTHWASH
15.00 mL | MOUTHWASH | Freq: Two times a day (BID) | Status: DC
Start: 2017-01-24 — End: 2017-01-27
  Administered 2017-01-24: 15 mL via ORAL
  Administered 2017-01-24: 0 mL via ORAL
  Administered 2017-01-25 – 2017-01-27 (×5): 15 mL via ORAL
  Filled 2017-01-24 (×8): qty 15

## 2017-01-24 MED ORDER — FENTANYL (PF) 50 MCG/ML INJECTION SOLUTION
INTRAMUSCULAR | Status: AC
Start: 2017-01-24 — End: 2017-01-24
  Filled 2017-01-24: qty 2

## 2017-01-24 MED ORDER — INSULIN REGULAR HUMAN 100 UNIT/ML INJECTION SSIP
3.0000 [IU] | INJECTION | Freq: Four times a day (QID) | SUBCUTANEOUS | Status: DC | PRN
Start: 2017-01-24 — End: 2017-01-27
  Administered 2017-01-25: 3 [IU] via SUBCUTANEOUS
  Filled 2017-01-24: qty 3

## 2017-01-24 MED ORDER — FENTANYL (PF) 50 MCG/ML INJECTION SOLUTION
Freq: Once | INTRAMUSCULAR | Status: DC | PRN
Start: 2017-01-24 — End: 2017-01-24
  Administered 2017-01-24: 100 ug via INTRAVENOUS
  Administered 2017-01-24: 150 ug via INTRAVENOUS

## 2017-01-24 MED ORDER — SODIUM CHLORIDE 0.9 % INTRAVENOUS SOLUTION
INTRAVENOUS | Status: DC
Start: 2017-01-24 — End: 2017-01-24

## 2017-01-24 MED ORDER — SODIUM CHLORIDE 0.9 % INTRAVENOUS SOLUTION
INTRAVENOUS | Status: DC
Start: 2017-01-24 — End: 2017-01-27
  Administered 2017-01-25 – 2017-01-27 (×2): 0 via INTRAVENOUS

## 2017-01-24 MED ORDER — DEXTROSE 5 % IN WATER (D5W) INTRAVENOUS SOLUTION
2.0000 g | Freq: Three times a day (TID) | INTRAVENOUS | Status: AC
Start: 2017-01-25 — End: 2017-01-25
  Administered 2017-01-25 (×2): 0 g via INTRAVENOUS
  Filled 2017-01-24 (×2): qty 20

## 2017-01-24 MED ORDER — MIDAZOLAM 1 MG/ML INJECTION SOLUTION
INTRAMUSCULAR | Status: DC
Start: 2017-01-24 — End: 2017-01-24
  Filled 2017-01-24: qty 10

## 2017-01-24 MED ORDER — ALBUTEROL SULFATE 2.5 MG/3 ML (0.083 %) SOLUTION FOR NEBULIZATION
2.5000 mg | INHALATION_SOLUTION | Freq: Four times a day (QID) | RESPIRATORY_TRACT | Status: DC
Start: 2017-01-24 — End: 2017-01-27
  Administered 2017-01-24: 2.5 mg via RESPIRATORY_TRACT
  Administered 2017-01-24: 0 mg via RESPIRATORY_TRACT
  Administered 2017-01-25 – 2017-01-26 (×6): 2.5 mg via RESPIRATORY_TRACT
  Administered 2017-01-26: 0 mg via RESPIRATORY_TRACT
  Administered 2017-01-26: 2.5 mg via RESPIRATORY_TRACT
  Administered 2017-01-27 (×2): 0 mg via RESPIRATORY_TRACT
  Filled 2017-01-24 (×8): qty 3

## 2017-01-24 MED ORDER — FENTANYL (PF) 50 MCG/ML INJECTION SOLUTION
50.0000 ug | INTRAMUSCULAR | Status: DC | PRN
Start: 2017-01-24 — End: 2017-01-26
  Administered 2017-01-24 – 2017-01-25 (×9): 50 ug via INTRAVENOUS
  Filled 2017-01-24: qty 6
  Filled 2017-01-24 (×6): qty 2

## 2017-01-24 MED ORDER — PHENYLEPHRINE 0.5 MG/5 ML (100 MCG/ML)IN 0.9 % SOD.CHLORIDE IV SYRINGE
INJECTION | Freq: Once | INTRAVENOUS | Status: DC | PRN
Start: 2017-01-24 — End: 2017-01-24
  Administered 2017-01-24 (×2): 200 ug via INTRAVENOUS

## 2017-01-24 MED ORDER — NICARDIPINE 25 MG/10 ML INTRAVENOUS SOLUTION
INTRAVENOUS | Status: DC
Start: 2017-01-24 — End: 2017-01-24
  Filled 2017-01-24: qty 20

## 2017-01-24 MED ORDER — DIPHTH,PERTUSSIS(ACELL),TETANUS 2.5 LF UNIT-8 MCG-5 LF/0.5 ML IM SUSP
0.50 mL | Freq: Once | INTRAMUSCULAR | Status: AC
Start: 2017-01-24 — End: 2017-01-24
  Administered 2017-01-24: 0.5 mL via INTRAMUSCULAR
  Filled 2017-01-24: qty 0.5

## 2017-01-24 MED ORDER — ROCURONIUM 10 MG/ML INTRAVENOUS SOLUTION
Freq: Once | INTRAVENOUS | Status: DC | PRN
Start: 2017-01-24 — End: 2017-01-24
  Administered 2017-01-24: 50 mg via INTRAVENOUS

## 2017-01-24 MED ORDER — SODIUM CHLORIDE 0.9 % INTRAVENOUS SOLUTION
INTRAVENOUS | Status: DC | PRN
Start: 2017-01-24 — End: 2017-01-24

## 2017-01-24 MED ORDER — LEVETIRACETAM 500 MG TABLET
500.0000 mg | ORAL_TABLET | Freq: Two times a day (BID) | ORAL | Status: AC
Start: 2017-01-24 — End: 2017-01-31
  Administered 2017-01-24: 0 mg via GASTROSTOMY
  Administered 2017-01-24 – 2017-01-30 (×13): 500 mg via GASTROSTOMY
  Filled 2017-01-24 (×14): qty 1

## 2017-01-24 MED ORDER — KETAMINE 50 MG/ML INJECTION SOLUTION
INTRAMUSCULAR | Status: DC
Start: 2017-01-24 — End: 2017-01-24
  Filled 2017-01-24: qty 10

## 2017-01-24 MED ORDER — SODIUM CHLORIDE 3 % HYPERTONIC INTRAVENOUS INJECTION SOLUTION
250.00 mL/h | INTRAVENOUS | Status: AC
Start: 2017-01-24 — End: 2017-01-24
  Administered 2017-01-24: 14:00:00 0 mL/h via INTRAVENOUS
  Filled 2017-01-24: qty 500

## 2017-01-24 MED ORDER — SODIUM CHLORIDE 0.9 % (FLUSH) INJECTION SYRINGE
2.0000 mL | INJECTION | Freq: Three times a day (TID) | INTRAMUSCULAR | Status: DC
Start: 2017-01-24 — End: 2017-02-12
  Administered 2017-01-24: 0 mL
  Administered 2017-01-24 – 2017-01-25 (×2): 2 mL
  Administered 2017-01-25 (×2): 0 mL
  Administered 2017-01-26: 2 mL
  Administered 2017-01-26: 22 mL
  Administered 2017-01-26 – 2017-01-27 (×4): 0 mL
  Administered 2017-01-28: 2 mL
  Administered 2017-01-28: 0 mL
  Administered 2017-01-29 – 2017-01-31 (×9): 2 mL
  Administered 2017-02-01: 0 mL
  Administered 2017-02-01 – 2017-02-12 (×32): 2 mL

## 2017-01-24 MED ORDER — SODIUM CHLORIDE 3 % HYPERTONIC INTRAVENOUS INJECTION SOLUTION
INTRAVENOUS | Status: AC
Start: 2017-01-24 — End: 2017-01-24
  Administered 2017-01-24: 250 mL/h via INTRAVENOUS
  Filled 2017-01-24: qty 500

## 2017-01-24 MED ORDER — NICARDIPINE 25 MG/10 ML INTRAVENOUS SOLUTION
5.00 mg/h | INTRAVENOUS | Status: DC
Start: 2017-01-24 — End: 2017-01-27
  Administered 2017-01-24: 0 mg/h via INTRAVENOUS
  Administered 2017-01-26 (×2): 5 mg/h via INTRAVENOUS
  Administered 2017-01-26 (×2): 10.5 mg/h via INTRAVENOUS
  Administered 2017-01-26: 7.5 mg/h via INTRAVENOUS
  Administered 2017-01-26: 5 mg/h via INTRAVENOUS
  Administered 2017-01-26: 7.5 mg/h via INTRAVENOUS
  Administered 2017-01-26: 10 mg/h via INTRAVENOUS
  Administered 2017-01-26: 5 mg/h via INTRAVENOUS
  Administered 2017-01-26 (×2): 10 mg/h via INTRAVENOUS
  Administered 2017-01-26: 0 mg/h via INTRAVENOUS
  Administered 2017-01-26: 7.5 mg/h via INTRAVENOUS
  Administered 2017-01-26 (×2): 10 mg/h via INTRAVENOUS
  Administered 2017-01-26: 5 mg/h via INTRAVENOUS
  Administered 2017-01-27: 0 mg/h via INTRAVENOUS
  Administered 2017-01-27: 7.5 mg/h via INTRAVENOUS
  Administered 2017-01-27: 10 mg/h via INTRAVENOUS
  Administered 2017-01-27: 7.5 mg/h via INTRAVENOUS
  Filled 2017-01-24 (×5): qty 20

## 2017-01-24 MED ORDER — DEXTROSE 5 % IN WATER (D5W) INTRAVENOUS SOLUTION
2.0000 g | Freq: Three times a day (TID) | INTRAVENOUS | Status: DC
Start: 2017-01-25 — End: 2017-01-24
  Filled 2017-01-24: qty 20

## 2017-01-24 MED ORDER — FAMOTIDINE (PF) 20 MG/2 ML INTRAVENOUS SOLUTION
20.0000 mg | Freq: Two times a day (BID) | INTRAVENOUS | Status: DC
Start: 2017-01-24 — End: 2017-01-27
  Administered 2017-01-24: 0 mg via INTRAVENOUS
  Administered 2017-01-24 – 2017-01-26 (×5): 20 mg via INTRAVENOUS
  Filled 2017-01-24 (×8): qty 2

## 2017-01-24 MED ORDER — DEXAMETHASONE SODIUM PHOSPHATE 4 MG/ML INJECTION SOLUTION
Freq: Once | INTRAMUSCULAR | Status: DC | PRN
Start: 2017-01-24 — End: 2017-01-24
  Administered 2017-01-24: 8 mg via INTRAVENOUS

## 2017-01-24 MED ORDER — SODIUM CHLORIDE 0.9 % IRRIGATION SOLUTION
1000.0000 mL | Status: DC | PRN
Start: 2017-01-24 — End: 2017-01-27
  Administered 2017-01-24: 2000 mL

## 2017-01-24 MED ORDER — DOCUSATE SODIUM 50 MG/5 ML ORAL LIQUID
100.0000 mg | Freq: Every day | ORAL | Status: DC
Start: 2017-01-24 — End: 2017-01-31
  Administered 2017-01-24: 0 mg via GASTROSTOMY
  Administered 2017-01-25 – 2017-01-30 (×6): 100 mg via GASTROSTOMY
  Administered 2017-01-31: 0 mg via GASTROSTOMY
  Filled 2017-01-24 (×8): qty 10

## 2017-01-24 MED ADMIN — rocuronium 10 mg/mL intravenous solution: INTRAVENOUS | @ 15:00:00

## 2017-01-24 MED ADMIN — fentaNYL (PF) 50 mcg/mL intravenous solution: INTRAVENOUS | @ 20:00:00

## 2017-01-24 MED ADMIN — glycopyrrolate 0.2 mg/mL injection solution: INTRAVENOUS | @ 16:00:00

## 2017-01-24 MED ADMIN — neostigmine methylsulfate 1 mg/mL intravenous solution: INTRAVENOUS | @ 16:00:00

## 2017-01-24 MED ADMIN — lactated Ringers intravenous solution: INTRAVENOUS | @ 16:00:00 | NDC 00338011704

## 2017-01-24 MED ADMIN — electrolyte-A intravenous solution: INTRAMUSCULAR | @ 12:00:00 | NDC 00338022104

## 2017-01-24 MED ADMIN — oxyCODONE-acetaminophen 5 mg-325 mg tablet: ORAL | @ 21:00:00

## 2017-01-24 MED ADMIN — potassium chloride 20 mEq/L in 0.9 % sodium chloride intravenous: @ 12:00:00 | NDC 00338069104

## 2017-01-24 MED ADMIN — sodium chloride 0.9 % (flush) injection syringe: INTRAVENOUS | @ 11:00:00

## 2017-01-24 MED ADMIN — lactated Ringers intravenous solution: INTRAVENOUS | @ 15:00:00 | NDC 00338011704

## 2017-01-24 MED ADMIN — heparin (porcine) 5,000 unit/mL injection solution: INTRAVENOUS | @ 16:00:00

## 2017-01-24 SURGICAL SUPPLY — 16 items
APPL 70% ISPRP 2% CHG 26ML 13. 2X13.2IN CHLRPRP PREP DEHP-FR (WOUND CARE/ENTEROSTOMAL SUPPLY) ×2
APPL 70% ISPRP 2% CHG 26ML CHLRPRP HI-LT ORNG PREP STRL LF  DISP CLR (WOUND CARE SUPPLY) ×2 IMPLANT
CONTAINR POLYPROP 4OZ STL SCREW ON LID LEAK RST STRL SPECI LF (TUB)
CONV USE 153512 - CONTAINR POLYPROP 4OZ STL SCREW ON LID LEAK RST STRL SPECI LF (TUB) IMPLANT
CONV USE ITEM 337890 - PACK SURG BSIN 2 STRL LF  DISP (CUSTOM TRAYS & PACK) ×1 IMPLANT
CONV USE ITEM 338659 - PACK SURG CUSTOM GN ORTHO NONST DISP LF (CUSTOM TRAYS & PACK) ×1 IMPLANT
DRESSING XEROFORM 1 X 8IN 8884431302 (WOUND CARE SUPPLY) ×1 IMPLANT
KIT COLLECT COPAN ESWAB PLASTIC REG FLOCK 1ML WHT (MICR) IMPLANT
KIT COLLECT COPAN ESWAB PLASTI_C REG FLOCK 1ML WHT (MICR)
KIT PLS LAV PLSVC + FAN SPRAY STRL LF  DISP (IRR) ×1 IMPLANT
PACK BASIN DBL CUSTOM (CUSTOM TRAYS & PACK) ×1
PACK SURG CSTM GN ORTHO NONST DISP LF (CUSTOM TRAYS & PACK) ×1
SOL IRRG 0.9% NACL 3L PLASTIC CONTAINR UROMATIC LF (SOLUTIONS) ×1 IMPLANT
SOLUTION IRRG NS 3000CC 2B7127 4/CS (SOLUTIONS) ×1
SOLUTION IRRG NS 3000CC 2B7127_4/CS (SOLUTIONS) ×1
SUTURE 3-0 PS-1 ETHILON 18.0I_N BLK NYLON MONOF NYL N/ABSB (SUTURE/WOUND CLOSURE) IMPLANT

## 2017-01-24 NOTE — Procedures (Signed)
[facility]  Arterial Line Placement    Procedure Date: 01/24/2017 Time: 17:48   Procedure: Arterial Line Placement  Diagnosis: trauma, IPH   Indication: frequent arterial sampling    Description: Informed consent was obtained and a surgical time out was performed to confirm the correct patient and procedure. An Allen's test was negative. The skin was prepped with chlorhexidine.  A 20-gauge catheter was inserted in the right radial artery using the Seldinger Technique. Ultrasound guidance was not required. Good arterial blood was noted. The line was connected to the transducer. Arterial wave forms were noted on the monitor. The line was sutured in place and dressed in sterile fashion. Patient tolerated procedure without complication.      Denman George, M.D. 01/24/2017  PGY-2 - Department of Emergency Medicine   [facility]       Herschell Dimes, MD

## 2017-01-24 NOTE — Consults (Signed)
ORTHOPAEDICS POST OP CHECK    Ortho Attending: Dr. Wiliam Ke    SUBJECTIVE:   Patient remains intubated in SICU.    OBJECTIVE:   Filed Vitals:    01/24/17 1639 01/24/17 1645 01/24/17 1700 01/24/17 1800   BP:  106/65 114/70 123/83   Pulse:  91 94 96   Resp:  18 18 18    Temp: (!) 34.9 C (94.8 F) 35.2 C (95.4 F) 35.3 C (95.5 F) 35.9 C (96.6 F)   SpO2:  100% 100% 100%   intubated and sedated  Abd: soft, non-distended  Msk:  LUE: dressings in place c/d/i; restraints also in place; cap refill <2s distally to exposed fingers which are warm and dry; motor and sensory exam limited as patient remains intubated and sedated  LLE: dressing in place, c/d/i, cap refill <2s to exposed toes; palpable DP/PT pulses; motor and sensory exam limited as patient is still recovering from anesthesia    ASSESSMENT: 61 y.o. yo male s/p I&D L wrist and L knee traumatic arthrotomies.    PLAN:   - Weightbearing status: WBAT LUE/LLE  - PT/OT: OOB with PT/OT when able  - Antibiotics: Ancef x24 hrs post-op  - Pain control: per primary  - DVT prophylaxis: SCDs, per primary  - Diet: per primary; currently intubated  - will continue to follow    Avel Sensor, MD 01/24/2017, 17:24  Orthopaedic Resident  [facility]
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
## 2017-01-24 NOTE — Anesthesia Transfer of Care (Signed)
ANESTHESIA TRANSFER OF CARE   Terry Reid is a 61 y.o. ,male, Weight: 84.5 kg (186 lb 4.6 oz)   had Procedure(s):  IRRIGATION AND DEBRIDEMENT KNEE  IRRIGATION AND DEBRIDEMENT WRIST  performed  01/24/17   Primary Service: Lehman Prom, DO    Past Medical History:   Diagnosis Date   . Chronic fatigue syndrome    . Chronic fatigue syndrome       Allergy History as of 01/24/17      No Known Allergies              I completed my transfer of care / handoff to the receiving personnel during which we discussed:  Access, Airway, All key/critical aspects of case discussed, Analgesia, Antibiotics, Expectation of post procedure, Fluids/Product, Gave opportunity for questions and acknowledgement of understanding, Labs and PMHx                                              Additional Info:Patient transported to ICU vss, not problem encountered, report given to [employer], questions and concerns have been addressed.  Care transferred.                       Last OR Temp: Temperature: 35.1 C (95.2 F)  ABG:  PCO2 (VENOUS)   Date Value Ref Range Status   01/24/2017 52.00 (H) 41.00 - 51.00 mm/Hg Final     PO2 (VENOUS)   Date Value Ref Range Status   01/24/2017 47.0 35.0 - 50.0 mm/Hg Final     SODIUM   Date Value Ref Range Status   01/24/2017 133 (L) 136 - 145 mmol/L Final     POTASSIUM   Date Value Ref Range Status   01/24/2017 4.5 3.5 - 5.1 mmol/L Final     KETONES   Date Value Ref Range Status   01/24/2017 Negative Negative mg/dL Final     WHOLE BLOOD POTASSIUM   Date Value Ref Range Status   01/24/2017 5.0 3.5 - 5.0 mmol/L Final     CHLORIDE   Date Value Ref Range Status   01/24/2017 103 96 - 111 mmol/L Final     CALCIUM   Date Value Ref Range Status   01/24/2017 9.0 8.5 - 10.2 mg/dL Final     IONIZED CALCIUM   Date Value Ref Range Status   01/24/2017 1.22 1.10 - 1.36 mmol/L Final     LACTATE   Date Value Ref Range Status   01/24/2017 2.1 (H) 0.0 - 1.3 mmol/L Final     HEMOGLOBIN   Date Value Ref Range Status   01/24/2017  16.7 12.0 - 18.0 g/dL Final     OXYHEMOGLOBIN   Date Value Ref Range Status   01/24/2017 71.4 40.0 - 80.0 % Final     CARBOXYHEMOGLOBIN   Date Value Ref Range Status   01/24/2017 1.8 0.0 - 2.5 % Final     MET-HEMOGLOBIN   Date Value Ref Range Status   01/24/2017 2.0 0.0 - 3.5 % Final     BASE DEFICIT   Date Value Ref Range Status   01/24/2017 4.3 (H) -3.0 - 3.0 mmol/L Final     BICARBONATE (VENOUS)   Date Value Ref Range Status   01/24/2017 21.0 (L) 22.0 - 26.0 mmol/L Final     %FIO2 (VENOUS)   Date Value Ref Range  Status   01/24/2017 50.0 % Final     Airway:  EndoTracheal Tube 8.0 26 cm Lip (Active)   Airway Secure Device 01/24/2017  4:37 PM   Position Change Yes 01/24/2017  4:37 PM   Change Reason Routine 01/24/2017  4:37 PM   Retaped N 01/24/2017 11:10 AM     Blood pressure (!) 98/58, pulse 89, temperature 35.1 C (95.2 F), resp. rate 13, height 1.905 m (6' 3" ), weight 84.5 kg (186 lb 4.6 oz), SpO2 100 %.

## 2017-01-24 NOTE — Consults (Signed)
Neurosurgery  01/24/2017  11:05    [facility]  [id number]  10/07/1901, 61 y.o., male    Patient with suspected traumatic brain injury- pending imaging.  Examination prior to intubation obtained.   Full history and physical from neurosurgery to follow.    Colonel Bald, PA-C  [facility] Neurosurgery  01/24/2017, 11:20   Pager [phone]

## 2017-01-24 NOTE — Anesthesia Preprocedure Evaluation (Signed)
ANESTHESIA PRE-OP EVALUATION  Planned Procedure: IRRIGATION AND DEBRIDEMENT KNEE (Left )  Review of Systems     Physical Assessment      Airway           Neck ROM: limited    Facial hair    Endotracheal tube present      Dental                    Pulmonary    Breath sounds clear to auscultation       Cardiovascular    Rhythm: regular  Rate: Abnormal       Other findings            Plan  Planned anesthesia type: GETA    ASA 3 - emergent     Intravenous and Inhalational induction     Anesthetic plan and risks discussed with spouse.     Anesthesia issues/risks discussed are: Dental Injuries, PONV, Post-op Cognitive Dysfunction, Stroke, Blood Loss, Nerve Injuries, Eye /Visual Loss, Post-op Pain Management, Cardiac Events/MI, Aspiration, Sore Throat and Post-op Intubation/Ventilation.    Use of blood products discussed with spouse whom consented to blood products.       NPO Status: Full stomach precautions.         Plan discussed with CRNA.    (Patient intubated and ventilated.)             EKG: Within last six months   01/24/2017  CXR: In last year   01/24/2017

## 2017-01-24 NOTE — Nurses Notes (Signed)
16 fr council catheter placed in pt's bladder per Dr. Mali Crigger (Urology)

## 2017-01-24 NOTE — Nurses Notes (Signed)
Pt back to SICU bed 6 from OR following wash out of L knee and L wrist. See flowsheets.

## 2017-01-24 NOTE — Procedures (Deleted)
[facility]  Department of Orthopaedics  Bedside Joint Arthrotomy Evaluation    Ah Bott [facility]  1956/04/07  [id number]    Procedure Date: 01/24/2017  Procedure Time: 2:00 pm    Procedure Name: bedside joint arthrotomy evaluation  Indication: joint injection to rule out arthrotomy    Diagnosis: laceration  Location: L knee    Description:    Patient was verbally consented to receive a joint injection at the above-listed location to rule out arthromotomy. The joint was flexed, extended, and pronated/supinated prior to injection. The procedure field was then sterilely prepped and 60 mL of normal saline with methylene blue dye was injected into the joint. The joint was ranged through extension and flexion with close inspection for any blue dye extravasation from the the wounds.    Procedure Result: Positive for arthrotomy    --  Rosita Fire, MD  Resident, PGY-1  Department of Orthopaedics  Pager [phone]  Rosita Fire, MD  01/24/2017, 14:51

## 2017-01-24 NOTE — Code Documentation (Signed)
1104: 1st attempt at intubation with 7.5 ETT utilizing glidescope.   1106: Black, RN, utilizing ring cutter on left hand.   1108: 2nd attempt at intubation 8.0 ETT 26 at lip.
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
## 2017-01-24 NOTE — Nurses Notes (Signed)
Pt arrived to SICU bed 6 from ED. Attached to monitor; see flowsheets for assessment. SICU and TRB teams at bedside.
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
## 2017-01-24 NOTE — Anesthesia Postprocedure Evaluation (Signed)
Anesthesia Post Op Evaluation    Patient: Terry Reid  Procedure(s):  IRRIGATION AND DEBRIDEMENT KNEE    Last Vitals:Temperature: 35.7 C (96.3 F) (01/24/17 1400)  Heart Rate: (!) 115 (01/24/17 1400)  BP (Non-Invasive): (!) 154/100 (01/24/17 1400)  Respiratory Rate: 13 (01/24/17 1400)  SpO2-1: 99 % (01/24/17 1400)    Patient location during evaluation: ICU       Pain management: adequate  Anesthetic complications: no  Cardiovascular status: acceptable  Respiratory status: ventilator  Patient post-procedure temperature: Pt Normothermic

## 2017-01-24 NOTE — Procedures (Signed)
Encounter Date: 01/24/2017     Emergency Department Procedure:    Rapid Sequence Induction  Procedure performed at: 2774  Preoxygenated by: Bagging with 100% O2  Medications given for induction: Etomidate  For paralysis: Rocuronium  Documentation: Continuous EKG and SpO2 monitoring throughout procedure  O2 Saturation: Remained greater than 90%  Attending Physician: Synetta Shadow     Encounter Date: 01/24/2017     Emergency Department Procedure:    Endotracheal Intubation  Procedure performed at: 1106  Preoxygenated by: Bagging with 100% O2  ET Tube size: 8  Laryngoscope: Curved ([place])  Intubation Documentation: Vocal cords were visualized, Tube passed under direct visualization, No evidence of  aspiration, Suction was not used, Good & equal breath sounds were heard bilaterally over the lung apices, Endotrachial tube was advanced  thru vocal cords, Visual confirmation that the tube passed through the vocal cords, BVM ventilation was done between attempts, No sounds were heard over the epigastrium, ET tube fogged up on expiration, End tidal CO2 testing confirmed placement, Ballon was tested and found to be intact, Stylette was used  Number of attempts: 2 (First attempt by Dr. Tamala Julian. Second attempt by Dr. Corky Downs)  ET Tube position: 26 at the teeth  F/U CXR shows: Confirmed good tube placement  Attending Physician: Synetta Shadow    Encounter Date: 01/24/2017     Emergency Department Procedure:    Gastric Intubation  Procedure performed at: 1111  Indication: Head Trauma  Pre-Procedure Documentation: Tube was lubricated and inserted  Tube inserted: 16 fr.  Location: Mouth  Post Proc Documentation: Tube was successfully passed into the stomach, Placement confirmed with CXR  Attending Physician: Synetta Shadow    The following procedure note was written per dictation of Dr. Corky Downs. Florene Glen, SCRIBE    I personally performed the services described in this documentation, as scribed in my presence, and  it is both accurate and complete.    Marcelino Freestone, MD  01/25/2017, 12:12     I was present and directly supervised the entire procedure.   Mare Ferrari, MD
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
## 2017-01-24 NOTE — Procedures (Signed)
[facility] Line Placement    Procedure Date:  01/24/2017 Time:  1320  Procedure: Central Line Placement  Dx: traumatic brain injury  Indication: venous access and central pressure monitoring    Description:  Because of an urgent/emergent situation, informed consent was not obtained.  A surgical time out was performed to confirm correct patient and procedure.  The site was identified and prepped in the usual sterile fashion.  The skin was prepped with chlorhexidine and 1% was used for analgesia.  A triple  lumen central line was inserted in the right internal jugular vein with patient in trendelenburg position using the Seldinger Technique.  The vein was cannulated and the guidewire was placed.  The needle was removed and the dilator advanced.  The catheter was placed over the guidewire and the guidewire was then removed.  Ultrasound guidance was required.  All ports drew blood back and flushed easily.  The line was sutured in place and dressed in sterile fashion. CXR ordered to verify placement.  Patient tolerated procedure without complications.     Procedure was performed by Dr. Shon Hough.    Dr. Junius Finner was present, supervised and participated in the procedure.      Adrian Saran, MD  01/24/2017, 13:43  Plastic Surgery PGY-1   Pager [facility] Staff  I was present and supervised/observed the entire central line placement.  Electronically Signed by:    Herschell Dimes, MD    01/24/2017 14:02

## 2017-01-24 NOTE — Nurses Notes (Signed)
Neuro surgery paged and at bedside to complete baseline neuro assessment with RN. Patient sedation turned off for assessment. Patient localizes in the LUE and has no response to stimuli in RUE, RLE, LLE. Patient does not follow commands but spontaneously moves left side of body. Right side of body minimal to no movement. Patient's L pupil 4 equal and sluggish and R pupil 3 equal and sluggish. Patient a 3-5-1. Will continue to monitor with q1 neuro checks.

## 2017-01-24 NOTE — Respiratory Therapy (Signed)
Aggressive pulmonary eval- pt still intubated.  Will reorder rib fx protocol once extubated.
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
## 2017-01-24 NOTE — Care Plan (Signed)
Problem: Patient Care Overview (Adult,OB)  Goal: Plan of Care Review(Adult,OB)  The patient and/or their representative will communicate an understanding of their plan of care   Outcome: Ongoing (see interventions/notes)   pt requires surrogate, plan for OR today.  Per wife, she is MPOA but documents are at home at this time, MSW appointed wife, numerous friends from [place] was present in waiting room with wife, surrogate form placed on pt chart awaiting physician signature.  Wife provided two insurance cards to MSW, [facility] with ID# [id number] and [employer] ID# [id number], task sent to CM assist to check eligibility.  Will continue to follow for d/c needs.
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
## 2017-01-24 NOTE — OR Surgeon (Signed)
[facility]  BRIEF OPERATIVE NOTE        Patient: Xxavier Noon  MRN: [id number]  Date of Birth: [birth year]  Date of Service: 01/24/2017      Pre-Operative Diagnosis: traumatic L knee arthrotomy   Post-Operative Diagnosis: same, L wrist arthrotomy  Procedure(s)/Description:  I&D L wrist and knee arthrotomy  Findings: na    Attending Surgeon: Wiliam Ke  Assistant(s): D Doug Sou    Anesthesia Type: General endotracheal anesthesia  Estimated Blood Loss: less than 100 ml  Blood Given: none  Fluids Given: see anesthesia notes; crystalloid  Tourniquet time: na  Drains: None  Implants: none    Wound Class: Clean Wound: Uninfected operative wounds in which no inflammation occurred  Specimens/Cultures: none  Complications: none             Disposition/condition: ICU - intubated and hemodynamically stable.    Laurey Arrow, MD 01/24/2017, 16:29  [place] [facility] of Orthopaed[facility]  Pager [phone]

## 2017-01-24 NOTE — Care Plan (Signed)
Problem: Patient Care Overview (Adult,OB)  Goal: Plan of Care Review(Adult,OB)  The patient and/or their representative will communicate an understanding of their plan of care   Outcome: Ongoing (see interventions/notes)  Pt currently on simv-18, 550 tidal volume, 50% and peep of 8 and appears to be tolerating.  Pt just came out of O.R and will obtain weaning parameters in am to assess pt for possible extubation.

## 2017-01-24 NOTE — Anesthesia OR-ICU Handoff (Signed)
Anesthesia ICU Transfer of Terry Reid is a 61 y.o. ,male, Weight: 84.5 kg (186 lb 4.6 oz)   had Procedure(s):  IRRIGATION AND DEBRIDEMENT KNEE  performed  01/24/17   Primary Service: Lehman Prom, DO    Past Medical History:   Diagnosis Date   . Chronic fatigue syndrome    . Chronic fatigue syndrome       Allergy History as of 01/24/17      No Known Allergies              I completed my ICU transfer of care/ Handoff to the ICU receiving personnel during which we discussed :  Access, Airway, All key and critical aspects of case discussed, Analgesia, Fluids/Product and Gave opportunity for questions and acknowledgement of understanding                                                                         Last OR Temp: Temperature: 35.7 C (96.3 F)  ABG:  PCO2 (VENOUS)   Date Value Ref Range Status   01/24/2017 52.00 (H) 41.00 - 51.00 mm/Hg Final     PO2 (VENOUS)   Date Value Ref Range Status   01/24/2017 47.0 35.0 - 50.0 mm/Hg Final     SODIUM   Date Value Ref Range Status   01/24/2017 133 (L) 136 - 145 mmol/L Final     POTASSIUM   Date Value Ref Range Status   01/24/2017 4.5 3.5 - 5.1 mmol/L Final     KETONES   Date Value Ref Range Status   01/24/2017 Negative Negative mg/dL Final     WHOLE BLOOD POTASSIUM   Date Value Ref Range Status   01/24/2017 5.0 3.5 - 5.0 mmol/L Final     CHLORIDE   Date Value Ref Range Status   01/24/2017 103 96 - 111 mmol/L Final     CALCIUM   Date Value Ref Range Status   01/24/2017 9.0 8.5 - 10.2 mg/dL Final     IONIZED CALCIUM   Date Value Ref Range Status   01/24/2017 1.22 1.10 - 1.36 mmol/L Final     LACTATE   Date Value Ref Range Status   01/24/2017 2.1 (H) 0.0 - 1.3 mmol/L Final     HEMOGLOBIN   Date Value Ref Range Status   01/24/2017 16.7 12.0 - 18.0 g/dL Final     OXYHEMOGLOBIN   Date Value Ref Range Status   01/24/2017 71.4 40.0 - 80.0 % Final     CARBOXYHEMOGLOBIN   Date Value Ref Range Status   01/24/2017 1.8 0.0 - 2.5 % Final     MET-HEMOGLOBIN   Date Value  Ref Range Status   01/24/2017 2.0 0.0 - 3.5 % Final     BASE DEFICIT   Date Value Ref Range Status   01/24/2017 4.3 (H) -3.0 - 3.0 mmol/L Final     BICARBONATE (VENOUS)   Date Value Ref Range Status   01/24/2017 21.0 (L) 22.0 - 26.0 mmol/L Final     %FIO2 (VENOUS)   Date Value Ref Range Status   01/24/2017 50.0 % Final     Airway:  EndoTracheal Tube 8.0 26 cm Lip (Active)   Airway Secure Device 01/24/2017  1:59 PM   Position Change Yes 01/24/2017  1:59 PM   Change Reason Routine 01/24/2017  1:59 PM   Retaped N 01/24/2017 11:10 AM

## 2017-01-25 ENCOUNTER — Inpatient Hospital Stay (HOSPITAL_COMMUNITY): Payer: BC Managed Care – PPO

## 2017-01-25 DIAGNOSIS — R569 Unspecified convulsions: Secondary | ICD-10-CM

## 2017-01-25 DIAGNOSIS — M50323 Other cervical disc degeneration at C6-C7 level: Secondary | ICD-10-CM

## 2017-01-25 DIAGNOSIS — M50322 Other cervical disc degeneration at C5-C6 level: Secondary | ICD-10-CM

## 2017-01-25 DIAGNOSIS — S2232XA Fracture of one rib, left side, initial encounter for closed fracture: Secondary | ICD-10-CM

## 2017-01-25 DIAGNOSIS — S062X0A Diffuse traumatic brain injury without loss of consciousness, initial encounter: Secondary | ICD-10-CM

## 2017-01-25 DIAGNOSIS — Z09 Encounter for follow-up examination after completed treatment for conditions other than malignant neoplasm: Secondary | ICD-10-CM

## 2017-01-25 DIAGNOSIS — I619 Nontraumatic intracerebral hemorrhage, unspecified: Secondary | ICD-10-CM

## 2017-01-25 LAB — ARTERIAL BLOOD GAS/LACTATE/CO-OX/LYTES (NA/K/CA/CL/GLUC) - ORS ONLY
%FIO2 (ARTERIAL): 50 %
BASE DEFICIT: 2 mmol/L (ref 0.0–3.0)
BASE DEFICIT: 1.5 mmol/L (ref 0.0–3.0)
BASE DEFICIT: 1 mmol/L (ref 0.0–3.0)
BICARBONATE (ARTERIAL): 23.4 mmol/L (ref 18.0–26.0)
BICARBONATE (ARTERIAL): 23.7 mmol/L (ref 18.0–26.0)
BICARBONATE (ARTERIAL): 24.1 mmol/L (ref 18.0–26.0)
CARBOXYHEMOGLOBIN: 2.2 % (ref 0.0–2.5)
CARBOXYHEMOGLOBIN: 1.8 % (ref 0.0–2.5)
CARBOXYHEMOGLOBIN: 2 % (ref 0.0–2.5)
CHLORIDE: 112 mmol/L — ABNORMAL HIGH (ref 96–111)
CHLORIDE: 115 mmol/L — ABNORMAL HIGH (ref 96–111)
CHLORIDE: 113 mmol/L — ABNORMAL HIGH (ref 96–111)
GLUCOSE: 134 mg/dL — ABNORMAL HIGH (ref 60–105)
GLUCOSE: 126 mg/dL — ABNORMAL HIGH (ref 60–105)
GLUCOSE: 121 mg/dL — ABNORMAL HIGH (ref 60–105)
HEMOGLOBIN: 12.5 g/dL (ref 12.0–18.0)
HEMOGLOBIN: 12.1 g/dL (ref 12.0–18.0)
HEMOGLOBIN: 11.9 g/dL — ABNORMAL LOW (ref 12.0–18.0)
IONIZED CALCIUM: 1.13 mmol/L (ref 1.10–1.30)
IONIZED CALCIUM: 1.14 mmol/L (ref 1.10–1.30)
IONIZED CALCIUM: 1.16 mmol/L (ref 1.10–1.30)
LACTATE: 1.8 mmol/L — ABNORMAL HIGH (ref 0.0–1.3)
LACTATE: 1.5 mmol/L — ABNORMAL HIGH (ref 0.0–1.3)
LACTATE: 1.9 mmol/L — ABNORMAL HIGH (ref 0.0–1.3)
MET-HEMOGLOBIN: 1.1 % (ref 0.0–3.5)
MET-HEMOGLOBIN: 2.4 % (ref 0.0–3.5)
MET-HEMOGLOBIN: 2.1 % (ref 0.0–3.5)
O2CT: 17.2 % (ref 15.7–24.3)
O2CT: 16.3 % (ref 15.7–24.3)
O2CT: 16.1 % (ref 15.7–24.3)
OXYHEMOGLOBIN: 96.7 % (ref 85.0–98.0)
OXYHEMOGLOBIN: 94.9 % (ref 85.0–98.0)
OXYHEMOGLOBIN: 95.3 % (ref 85.0–98.0)
PCO2 (ARTERIAL): 40 mmHg (ref 35.0–45.0)
PCO2 (ARTERIAL): 36 mmHg (ref 35.0–45.0)
PCO2 (ARTERIAL): 45 mmHg (ref 35.0–45.0)
PH (ARTERIAL): 7.37 (ref 7.35–7.45)
PH (ARTERIAL): 7.41 (ref 7.35–7.45)
PH (ARTERIAL): 7.35 (ref 7.35–7.45)
PO2 (ARTERIAL): 112 mmHg — ABNORMAL HIGH (ref 72.0–100.0)
PO2 (ARTERIAL): 99 mmHg (ref 72.0–100.0)
PO2 (ARTERIAL): 104 mmHg — ABNORMAL HIGH (ref 72.0–100.0)
SODIUM: 141 mmol/L (ref 136–145)
SODIUM: 140 mmol/L (ref 136–145)
SODIUM: 142 mmol/L (ref 136–145)
WHOLE BLOOD POTASSIUM: 4 mmol/L (ref 3.5–5.0)
WHOLE BLOOD POTASSIUM: 3.8 mmol/L (ref 3.5–5.0)
WHOLE BLOOD POTASSIUM: 3.6 mmol/L (ref 3.5–5.0)

## 2017-01-25 LAB — CBC
HCT: 38.6 % (ref 36.7–47.0)
HGB: 13.3 g/dL (ref 12.5–16.3)
MCH: 31.4 pg (ref 27.4–33.0)
MCHC: 34.3 g/dL (ref 32.5–35.8)
MCV: 91.3 fL (ref 78.0–100.0)
MPV: 7.1 fL — ABNORMAL LOW (ref 7.5–11.5)
PLATELETS: 342 x10ˆ3/uL (ref 140–450)
RBC: 4.23 x10ˆ6/uL (ref 4.06–5.63)
RDW: 12.6 % (ref 12.0–15.0)
WBC: 18.6 x10ˆ3/uL — ABNORMAL HIGH (ref 3.5–11.0)

## 2017-01-25 LAB — VERIFYNOW P2Y12 PRU: PRU Verifynow: 221 [PRU] (ref 194–418)

## 2017-01-25 LAB — PHOSPHORUS: PHOSPHORUS: 2.7 mg/dL (ref 2.3–4.0)

## 2017-01-25 LAB — BASIC METABOLIC PANEL
ANION GAP: 8 mmol/L (ref 4–13)
BUN/CREA RATIO: 18 (ref 6–22)
BUN: 14 mg/dL (ref 8–25)
CALCIUM: 8.4 mg/dL — ABNORMAL LOW (ref 8.5–10.2)
CHLORIDE: 113 mmol/L — ABNORMAL HIGH (ref 96–111)
CO2 TOTAL: 22 mmol/L (ref 22–32)
CREATININE: 0.79 mg/dL (ref 0.62–1.27)
ESTIMATED GFR: >59 mL/min/1.73m?2 (ref >59)
GLUCOSE: 166 mg/dL — ABNORMAL HIGH (ref 65–139)
POTASSIUM: 4.1 mmol/L (ref 3.5–5.1)
SODIUM: 143 mmol/L (ref 136–145)

## 2017-01-25 LAB — POC BLOOD GLUCOSE (RESULTS)
GLUCOSE, POC: 125 mg/dL — ABNORMAL HIGH (ref 70–105)
GLUCOSE, POC: 131 mg/dL — ABNORMAL HIGH (ref 70–105)
GLUCOSE, POC: 157 mg/dL — ABNORMAL HIGH (ref 70–105)

## 2017-01-25 LAB — ARTERIAL BLOOD GAS/LACTATE/CO-OX/LYTES (NA/K/CA/CL/GLUC)
%FIO2 (ARTERIAL): 50 %
%FIO2 (ARTERIAL): 50 %
HEMOGLOBIN: 11.9 g/dL — ABNORMAL LOW (ref 12.0–18.0)
OXYHEMOGLOBIN: 94.9 % (ref 85.0–98.0)
PAO2/FIO2 RATIO: 224 (ref <=200)
PAO2/FIO2 RATIO: 198 (ref <=200)
PAO2/FIO2 RATIO: 208 (ref <=200)

## 2017-01-25 LAB — MAGNESIUM: MAGNESIUM: 1.7 mg/dL (ref 1.6–2.5)

## 2017-01-25 LAB — H & H
HCT: 37 % (ref 36.7–47.0)
HCT: 35.4 % — ABNORMAL LOW (ref 36.7–47.0)
HCT: 35.7 % — ABNORMAL LOW (ref 36.7–47.0)
HGB: 12.7 g/dL (ref 12.5–16.3)
HGB: 12.4 g/dL — ABNORMAL LOW (ref 12.5–16.3)
HGB: 12.2 g/dL — ABNORMAL LOW (ref 12.5–16.3)

## 2017-01-25 LAB — VERIFYNOW ASPIRIN TEST
ASPIRIN VERIFYNOW: 600 {ARU} (ref >=550)
ASPIRIN VERIFYNOW: 600 {ARU} (ref >=550)

## 2017-01-25 MED ORDER — GADOBENATE DIMEGLUMINE 529 MG/ML(0.1 MMOL/0.2 ML) INTRAVENOUS SOLUTION
17.00 mL | INTRAVENOUS | Status: AC
Start: 2017-01-25 — End: 2017-01-25
  Administered 2017-01-25: 15:00:00 17 mL via INTRAVENOUS

## 2017-01-25 MED ORDER — METOPROLOL TARTRATE 5 MG/5 ML INTRAVENOUS SOLUTION
5.0000 mg | INTRAVENOUS | Status: AC
Start: 2017-01-26 — End: 2017-01-25
  Administered 2017-01-25: 5 mg via INTRAVENOUS
  Filled 2017-01-25: qty 5

## 2017-01-25 MED ORDER — IOPAMIDOL 370 MG IODINE/ML (76 %) INTRAVENOUS SOLUTION
50.00 mL | INTRAVENOUS | Status: AC
Start: 2017-01-25 — End: 2017-01-25
  Administered 2017-01-25: 13:00:00 50 mL via INTRAVENOUS

## 2017-01-25 MED ORDER — OXYCODONE 20 MG/ML ORAL CONCENTRATE
10.00 mg | ORAL | Status: DC | PRN
Start: 2017-01-25 — End: 2017-01-27
  Administered 2017-01-25 – 2017-01-26 (×3): 10 mg via GASTROSTOMY
  Filled 2017-01-25 (×4): qty 0.5

## 2017-01-25 MED ADMIN — fentaNYL (PF) 50 mcg/mL intravenous solution: INTRAVENOUS | @ 17:00:00

## 2017-01-25 MED ADMIN — oxyCODONE 20 mg/mL oral concentrate: GASTROSTOMY | @ 19:00:00

## 2017-01-25 MED ADMIN — albuterol sulfate 2.5 mg/3 mL (0.083 %) solution for nebulization: RESPIRATORY_TRACT | @ 08:00:00

## 2017-01-25 MED ADMIN — lactated Ringers intravenous solution: GASTROSTOMY | @ 10:00:00 | NDC 00338011704

## 2017-01-25 MED ADMIN — SITagliptin phosphate 50 mg tablet: INTRAVENOUS | @ 16:00:00

## 2017-01-25 MED ADMIN — furosemide 20 mg tablet: INTRAVENOUS | @ 21:00:00

## 2017-01-25 MED ADMIN — meclizine 25 mg tablet: INTRAVENOUS | @ 06:00:00

## 2017-01-25 MED ADMIN — lanolin-oxyquin-pet, hydrophil topical ointment: RESPIRATORY_TRACT | @ 12:00:00 | NDC 09999989257

## 2017-01-25 MED ADMIN — HYDROmorphone (PF) 30 mg/30 mL (1 mg/mL) in 0.9 % NaCl IV PCA syringe: ORAL | @ 10:00:00

## 2017-01-25 NOTE — Care Plan (Signed)
Physical Therapy Progress Note        PT ordered received and chart reviewed. Pt is currently on active bedrest and log roll only orders , will follow up with pt as appropriate. Thank you for the consult, please contact with any questions or concerns.    Rexford Maus, DPT, PT.     Pager [phone]
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
## 2017-01-25 NOTE — Ancillary Notes (Signed)
MRI brain and cervical completed.  [place]  MRI Technologist Note        MRI has been completed.        Floy Sabina, RTR 01/25/2017, 15:10
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
## 2017-01-25 NOTE — Care Plan (Addendum)
Problem: Ventilation, Mechanical Invasive (Adult)  Prevent and manage potential problems including:  1. artificial airway induced skin/tissue breakdown  2. gastritis/stress ulcer  3. immobility  4. inability to wean  5. malnutrition  6. mechanical dysfunction  7. situational response  8. ventilator-induced lung injury   Goal: Signs and Symptoms of Listed Potential Problems Will be Absent, Minimized or Managed (Ventilation, Mechanical Invasive)  Signs and symptoms of listed potential problems will be absent, minimized or managed by discharge/transition of care (reference Ventilation, Mechanical Invasive (Adult) CPG).   Outcome: Ongoing (see interventions/notes)  01/24/17 1300  VENTILATOR - SIMV(PRVC)PS / APV-SIMV (VENTILATOR - SIMV(PRVC)PS / APV-SIMV (Adult Patients Only)) CONTINUOUS Discontinue   Duration: Until Specified    Priority: Routine       Question Answer Comment   FIO2 (%) 50    Peep(cm/H2O) 8    Rate(bpm) 18    Tidal Volume(mls) 550    Pressure Support(cm/H2O) 10    Indications IMPROVE DISTRIBUTION OF VENTILATION        01/24/17 1159      MAR Note      Weaned peep down to 5, tolerating well at this time. Unable to wean any furthur. Wean as tolerates. WP in morning if patient wakes up and follows.     WP parameters no done, patient not following and on a rate of 18.

## 2017-01-25 NOTE — Nurses Notes (Signed)
Patient returned from MRI. See doc flowsheets for more information.

## 2017-01-25 NOTE — Ancillary Notes (Signed)
SBIRT    SBIRT not completed at this time due to patient's mental status - GCS of 9. Pending recommendations:  Attempt to complete SBIRT at later time/date.    Peyton Bottoms, [facility] Clinical Therapist 01/25/2017, 08:32  Pager  [phone]

## 2017-01-25 NOTE — Ancillary Notes (Signed)
[facility]  Neurolab Tech Note    Terry Reid  DATE OF SERVICE:  01/25/2017          EEG has been completed.      Tsosie Billing, [facility]

## 2017-01-25 NOTE — Progress Notes (Signed)
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 to assess for vascular injury. MRI brain and C-spine ordered by Neurosurgery.  Follow-up CT scan brain this afternoon.   Neurosurgery following  -HH q4 hours   -maintain systolic blood pressures less than 160 nicardipine  drip ordered if needed        Denman George, MD   01/25/2017, 09:36      Late entry for 01/25/17. I saw and examined the patient.  I reviewed the resident's note.  I agree with the findings and plan of care as documented in the resident's note.  Any exceptions/additions are edited/noted.    Mr Kinsel is in the SICU as a P1 trauma after [facility] with the following critical care issues.  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 Patient is intubated. We are performing neuromonitoring. He is on keppra. We will pay close attention to his bp and oxygen sats. Neurosurgery consult and repeat  head ct yesterday as well as extracranial CTA to be reviewed. MRI per neurosurgery.  No evidence of c spine fracture  Rib fractures including 1st rib fx. On rib fx protocol including pulmonary toilet. Pain control.  On ventilator doing well with CMV 14/550/50/8. Follow abg and cxr. Will wake patient and get weaning parameters and attempt to extubate  GU placed foley for inability by our team to place foley  Will start tube feeds if not able to extubate.    Critical Care Attestation    I was present at the bedside of this critically ill patient for 30 minutes  exclusive of procedures.  This patient suffers from failure or dysfunction of Neurologic/Sensory/pulmonary/gi and nutrition system(s).  The care of this patient was in regard to managing (a) conditions(s) that has a high probability of sudden, clinically significant, or life-threatening deterioration and required a high degree of Attending Physician attention and direct involvement to intervene urgently. Data review and care planning was performed in direct proximity of the patient, examination was obviously performed in direct contact with the patient. All of this time was exclusive of procedure which will be documented elsewhere in the chart.    My critical care time is independent and unique to other providers (no other providers saw patient for purposes of sicu evaluation)  My critical care time involved full attention to the patients' condition and included:    Review of nursing notes and/or old charts  Review of medications, allergies, and vital signs  Documentation time  Consultant collaboration on findings and treatment options  Care, transfer of care, and discharge plans  Ordering, interpreting, and reviewing diagnostic studies/tab tests  Obtaining necessary history from family, EMS, nursing home staff and/or treating physicians    My critical care time did not include time spent teaching resident physician(s) or other services of resident physicians, or performing other reported procedures.  Total Critical Care Time: 30 minutes    Herschell Dimes, MD

## 2017-01-25 NOTE — Care Plan (Signed)
Problem: Patient Care Overview (Adult,OB)  Goal: Plan of Care Review(Adult,OB)  The patient and/or their representative will communicate an understanding of their plan of care   Outcome: Ongoing (see interventions/notes)  Patient sedated and rested throughout the night. He remains a 3-5-1, localizing in the LUE, and no response to stimuli in the RUE, RLE, LLE, but spontaneously moves the left side of his body. He has minimal to no movement of the right side of his body. L pupil is 3 equal and sluggish, R pupil is 2 equal and sluggish, neurosurgery is aware. CT completed overnight. Q2 turns maintained. Plan for MRI on 4/21. Continuing to monitor.     Problem: Skin Injury Risk (Adult,Obstetrics,Pediatric)  Goal: Identify Related Risk Factors and Signs and Symptoms  Related risk factors and signs and symptoms are identified upon initiation of Human Response Clinical Practice Guideline (CPG).   Outcome: Completed Date Met: 01/25/17    Goal: Skin Health and Integrity  Patient will demonstrate the desired outcomes by discharge/transition of care.   Outcome: Ongoing (see interventions/notes)      Problem: Ventilation, Mechanical Invasive (Adult)  Prevent and manage potential problems including:  1. artificial airway induced skin/tissue breakdown  2. gastritis/stress ulcer  3. immobility  4. inability to wean  5. malnutrition  6. mechanical dysfunction  7. situational response  8. ventilator-induced lung injury   Goal: Signs and Symptoms of Listed Potential Problems Will be Absent, Minimized or Managed (Ventilation, Mechanical Invasive)  Signs and symptoms of listed potential problems will be absent, minimized or managed by discharge/transition of care (reference Ventilation, Mechanical Invasive (Adult) CPG).   Outcome: Completed Date Met: 01/25/17

## 2017-01-25 NOTE — Consults (Signed)
Orthopaedic Trauma Progress Note    01/25/2017  1 Day Post-Op S/P I&D L knee and wrist    Subjective: intubated, sedated    Labs:   BMP:     Recent Labs      01/25/17   0031   01/25/17   0556   SODIUM  143   < >  140   POTASSIUM  4.1   --    --    CHLORIDE  113*   < >  115*   CO2  22   --    --    BUN  14   --    --    CREATININE  0.79   --    --    GLUCOSENF  166*   --    --    ANIONGAP  8   --    --    BUNCRRATIO  18   --    --    GFR  >59   --    --    CALCIUM  8.4*   --    --     < > = values in this interval not displayed.     CBC Results Differential Results   Recent Labs      01/25/17   0031   01/25/17   0556   WBC  18.6*   --    --    HGB  13.3   < >  12.4*   HCT  38.6   < >  35.4*   PLTCNT  342   --    --     < > = values in this interval not displayed.    Recent Results (from the past 30 hour(s))   CBC WITH DIFF    Collection Time: 01/24/17 10:57 AM   Result Value    WBC 15.0 (H)    NEUTROPHIL % 77    LYMPHOCYTE % 16    MONOCYTE % 6    EOSINOPHIL % 0    BASOPHIL % 1    BASOPHIL # 0.14          Physical Exam: Blood pressure 117/73, pulse (!) 112, temperature 36.8 C (98.2 F), resp. rate 13, height 1.905 m (6\' 3" ), weight 84.5 kg (186 lb 4.6 oz), SpO2 98 %.  Intubated, sedated  Tachycardic  Dressings dry over left elbow/knee  No significant swelling to extremities  2+ DP pulses bilaterally  Unable to assess/motor sensation    Assessment/ Plan:   Terry Reid is a 61 y.o. male S/P I&D L knee and elbow arthrotomy  Will need tertiary exam when able  WBAT LLE, LUE  Post op antibiotics  Care per SICU  No limitations based on LLE/LUE    Laurey Arrow, MD 01/25/2017, 08:47  [facility]  Pager # [phone]

## 2017-01-26 ENCOUNTER — Inpatient Hospital Stay (HOSPITAL_COMMUNITY): Payer: BC Managed Care – PPO | Admitting: Radiology

## 2017-01-26 ENCOUNTER — Inpatient Hospital Stay (HOSPITAL_COMMUNITY): Payer: BC Managed Care – PPO

## 2017-01-26 DIAGNOSIS — R4182 Altered mental status, unspecified: Secondary | ICD-10-CM

## 2017-01-26 DIAGNOSIS — J9811 Atelectasis: Secondary | ICD-10-CM

## 2017-01-26 DIAGNOSIS — Z4682 Encounter for fitting and adjustment of non-vascular catheter: Secondary | ICD-10-CM

## 2017-01-26 DIAGNOSIS — S06360A Traumatic hemorrhage of cerebrum, unspecified, without loss of consciousness, initial encounter: Secondary | ICD-10-CM | POA: Diagnosis present

## 2017-01-26 DIAGNOSIS — J90 Pleural effusion, not elsewhere classified: Secondary | ICD-10-CM

## 2017-01-26 DIAGNOSIS — S0633AA Contusion and laceration of cerebrum, unspecified, with loss of consciousness status unknown, initial encounter: Secondary | ICD-10-CM | POA: Diagnosis present

## 2017-01-26 LAB — ARTERIAL BLOOD GAS/LACTATE/CO-OX/LYTES (NA/K/CA/CL/GLUC) - ORS ONLY
BASE EXCESS (ARTERIAL): 2.3 mmol/L — ABNORMAL HIGH (ref 0.0–1.0)
BICARBONATE (ARTERIAL): 26.6 mmol/L — ABNORMAL HIGH (ref 18.0–26.0)
CARBOXYHEMOGLOBIN: 2.4 % (ref 0.0–2.5)
CHLORIDE: 112 mmol/L — ABNORMAL HIGH (ref 96–111)
GLUCOSE: 130 mg/dL — ABNORMAL HIGH (ref 60–105)
HEMOGLOBIN: 11.9 g/dL — ABNORMAL LOW (ref 12.0–18.0)
IONIZED CALCIUM: 1.2 mmol/L (ref 1.10–1.30)
LACTATE: 1.3 mmol/L (ref 0.0–1.3)
MET-HEMOGLOBIN: 2.1 % (ref 0.0–3.5)
O2CT: 15.6 % — ABNORMAL LOW (ref 15.7–24.3)
OXYHEMOGLOBIN: 93.1 % (ref 85.0–98.0)
PAO2/FIO2 RATIO: 243 (ref <=200)
PCO2 (ARTERIAL): 43 mmHg (ref 35.0–45.0)
PH (ARTERIAL): 7.41 (ref 7.35–7.45)
PO2 (ARTERIAL): 68 mmHg — ABNORMAL LOW (ref 72.0–100.0)
SODIUM: 140 mmol/L (ref 136–145)
WHOLE BLOOD POTASSIUM: 3.5 mmol/L (ref 3.5–5.0)

## 2017-01-26 LAB — ECG 12-LEAD
Atrial Rate: 94 {beats}/min
Atrial Rate: 107 {beats}/min
Calculated P Axis: 75 degrees
Calculated P Axis: 62 degrees
Calculated R Axis: 46 degrees
Calculated R Axis: 38 degrees
Calculated T Axis: 76 degrees
Calculated T Axis: 80 degrees
PR Interval: 154 ms
PR Interval: 152 ms
QRS Duration: 98 ms
QRS Duration: 98 ms
QRS Duration: 102 ms
QT Interval: 352 ms
QT Interval: 358 ms
QTC Calculation: 440 ms
QTC Calculation: 477 ms
Ventricular rate: 94 {beats}/min
Ventricular rate: 107 {beats}/min

## 2017-01-26 LAB — BASIC METABOLIC PANEL
ANION GAP: 6 mmol/L (ref 4–13)
BUN/CREA RATIO: 18 (ref 6–22)
BUN: 11 mg/dL (ref 8–25)
CALCIUM: 8.5 mg/dL (ref 8.5–10.2)
CHLORIDE: 110 mmol/L (ref 96–111)
CO2 TOTAL: 25 mmol/L (ref 22–32)
CREATININE: 0.62 mg/dL (ref 0.62–1.27)
ESTIMATED GFR: >59 mL/min/1.73mˆ2 (ref >59)
GLUCOSE: 124 mg/dL (ref 65–139)
POTASSIUM: 3.7 mmol/L (ref 3.5–5.1)
SODIUM: 141 mmol/L (ref 136–145)

## 2017-01-26 LAB — CBC
HCT: 35.7 % — ABNORMAL LOW (ref 36.7–47.0)
HGB: 12.1 g/dL — ABNORMAL LOW (ref 12.5–16.3)
MCH: 30.8 pg (ref 27.4–33.0)
MCHC: 34 g/dL (ref 32.5–35.8)
MCV: 90.7 fL (ref 78.0–100.0)
MPV: 6.9 fL — ABNORMAL LOW (ref 7.5–11.5)
PLATELETS: 274 x10ˆ3/uL (ref 140–450)
RBC: 3.94 x10ˆ6/uL — ABNORMAL LOW (ref 4.06–5.63)
RDW: 12.8 % (ref 12.0–15.0)
WBC: 18.7 x10ˆ3/uL — ABNORMAL HIGH (ref 3.5–11.0)

## 2017-01-26 LAB — POC BLOOD GLUCOSE (RESULTS)
GLUCOSE, POC: 119 mg/dL — ABNORMAL HIGH (ref 70–105)
GLUCOSE, POC: 122 mg/dL — ABNORMAL HIGH (ref 70–105)
GLUCOSE, POC: 133 mg/dL — ABNORMAL HIGH (ref 70–105)
GLUCOSE, POC: 137 mg/dL — ABNORMAL HIGH (ref 70–105)

## 2017-01-26 LAB — PHOSPHORUS
PHOSPHORUS: 1.7 mg/dL — ABNORMAL LOW (ref 2.3–4.0)
PHOSPHORUS: 2.4 mg/dL (ref 2.3–4.0)

## 2017-01-26 LAB — MAGNESIUM: MAGNESIUM: 1.9 mg/dL (ref 1.6–2.5)

## 2017-01-26 LAB — ARTERIAL BLOOD GAS/LACTATE/CO-OX/LYTES (NA/K/CA/CL/GLUC): %FIO2 (ARTERIAL): 28 %

## 2017-01-26 MED ORDER — METOPROLOL TARTRATE 5 MG/5 ML INTRAVENOUS SOLUTION
5.0000 mg | INTRAVENOUS | Status: AC
Start: 2017-01-26 — End: 2017-01-26
  Administered 2017-01-26: 5 mg via INTRAVENOUS
  Filled 2017-01-26: qty 5

## 2017-01-26 MED ORDER — SODIUM DI- AND MONOPHOSPHATE-POTASSIUM PHOS MONOBASIC 250 MG TABLET
250.0000 mg | ORAL_TABLET | Freq: Once | ORAL | Status: AC
Start: 2017-01-26 — End: 2017-01-26
  Administered 2017-01-26: 250 mg via GASTROSTOMY
  Filled 2017-01-26: qty 1

## 2017-01-26 MED ORDER — METOPROLOL TARTRATE 5 MG/5 ML INTRAVENOUS SOLUTION
INTRAVENOUS | Status: AC
Start: 2017-01-26 — End: 2017-01-27
  Administered 2017-01-26: 0 mg
  Filled 2017-01-26: qty 5

## 2017-01-26 MED ORDER — MORPHINE 4 MG/ML INTRAVENOUS CARTRIDGE
4.0000 mg | CARTRIDGE | INTRAVENOUS | Status: DC | PRN
Start: 2017-01-26 — End: 2017-01-27
  Administered 2017-01-26 – 2017-01-27 (×5): 4 mg via INTRAVENOUS
  Filled 2017-01-26 (×6): qty 1

## 2017-01-26 MED ORDER — METOPROLOL TARTRATE 25 MG TABLET
25.0000 mg | ORAL_TABLET | Freq: Two times a day (BID) | ORAL | Status: DC
Start: 2017-01-26 — End: 2017-01-27
  Administered 2017-01-26 (×2): 25 mg via ORAL
  Filled 2017-01-26 (×5): qty 1

## 2017-01-26 MED ORDER — METOPROLOL TARTRATE 5 MG/5 ML INTRAVENOUS SOLUTION
5.0000 mg | INTRAVENOUS | Status: AC
Start: 2017-01-26 — End: 2017-01-26
  Administered 2017-01-26 (×2): 5 mg via INTRAVENOUS

## 2017-01-26 MED ORDER — METOPROLOL TARTRATE 5 MG/5 ML INTRAVENOUS SOLUTION
5.0000 mg | Freq: Four times a day (QID) | INTRAVENOUS | Status: DC | PRN
Start: 2017-01-26 — End: 2017-01-27
  Administered 2017-01-26 – 2017-01-27 (×5): 5 mg via INTRAVENOUS
  Filled 2017-01-26 (×6): qty 5

## 2017-01-26 MED ORDER — FENTANYL (PF) 50 MCG/ML INJECTION SOLUTION
25.00 ug | INTRAMUSCULAR | Status: DC | PRN
Start: 2017-01-26 — End: 2017-01-27
  Administered 2017-01-26 (×3): 25 ug via INTRAVENOUS
  Filled 2017-01-26 (×3): qty 2

## 2017-01-26 MED ORDER — LABETALOL 20 MG/4 ML (5 MG/ML) INTRAVENOUS SYRINGE
5.0000 mg | INJECTION | INTRAVENOUS | Status: DC | PRN
Start: 2017-01-26 — End: 2017-01-27
  Administered 2017-01-26 (×8): 5 mg via INTRAVENOUS
  Filled 2017-01-26 (×4): qty 4

## 2017-01-26 MED ADMIN — labetaloL 20 mg/4 mL (5 mg/mL) intravenous syringe: INTRAVENOUS | @ 20:00:00

## 2017-01-26 MED ADMIN — morphine 4 mg/mL intravenous syringe: INTRAVENOUS | @ 12:00:00

## 2017-01-26 MED ADMIN — Medication: INTRAVENOUS

## 2017-01-26 MED ADMIN — senna leaf extract 176 mg/5 mL oral syrup: GASTROSTOMY | @ 12:00:00

## 2017-01-26 MED ADMIN — bacitracin 500 unit/gram topical ointment: INTRAVENOUS | @ 09:00:00 | NDC 45802006001

## 2017-01-26 MED ADMIN — lactated Ringers intravenous solution: INTRAVENOUS | @ 19:00:00

## 2017-01-26 NOTE — Nurses Notes (Signed)
01/26/17 1400   Glasgow Coma Scale   Best Eye Response 2-->(E2) to pain   Best Motor Response 1-->(M1) none   Best Verbal Response 2-->(V2) incomprehensible speech   Glasgow Coma Scale Score 5     Dr. Lester Carolina of SICU notified regarding pt's neuro status. Pt will open eyes to vigorous pain. Pt moving left upper and lower extremities spontaneously, but not to command. SICU stating to reassess in one hour due to pt's injury. Will continue to monitor.

## 2017-01-26 NOTE — Consults (Signed)
Orthopaedic Trauma Progress Note    01/26/2017  2 Days Post-Op S/P I&D L knee and wrist    Subjective: extubated, does not follow commands    Labs:   BMP:     Recent Labs      01/26/17   0029   SODIUM  140  141   POTASSIUM  3.7   CHLORIDE  112*  110   CO2  25   BUN  11   CREATININE  0.62   GLUCOSENF  124   ANIONGAP  6   BUNCRRATIO  18   GFR  >59   CALCIUM  8.5     CBC Results Differential Results   Recent Labs      01/26/17   0029   WBC  18.7*   HGB  12.1*   HCT  35.7*   PLTCNT  274    No results found for this or any previous visit (from the past 30 hour(s)).       Physical Exam: Blood pressure (!) 153/75, pulse (!) 117, temperature 37.8 C (100 F), resp. rate 18, height 1.905 m (6\' 3" ), weight 84.5 kg (186 lb 4.6 oz), SpO2 92 %.  Extubated, not following commands  In cervical collar  Tachycardic  Dressings changed over knee and elbow  No significant swelling to extremities  2+ DP pulses bilaterally  Unable to assess/motor sensation    Assessment/ Plan:   Terry Reid is a 61 y.o. male S/P I&D L knee and elbow arthrotomy  No bruising or swelling otherwise noticed  WBAT LLE, LUE  Post op antibiotics  Care per SICU  No limitations based on LLE/LUE    Laurey Arrow, MD 01/26/2017, 07:03  [place]  Pager # [phone]

## 2017-01-26 NOTE — Care Plan (Signed)
Problem: Ventilation, Mechanical Invasive (Adult)  Prevent and manage potential problems including:  1. artificial airway induced skin/tissue breakdown  2. gastritis/stress ulcer  3. immobility  4. inability to wean  5. malnutrition  6. mechanical dysfunction  7. situational response  8. ventilator-induced lung injury   Goal: Signs and Symptoms of Listed Potential Problems Will be Absent, Minimized or Managed (Ventilation, Mechanical Invasive)  Signs and symptoms of listed potential problems will be absent, minimized or managed by discharge/transition of care (reference Ventilation, Mechanical Invasive (Adult) CPG).   Outcome: Ongoing (see interventions/notes)  Patient weaned to PSV then extubated to 2 liter NC. Tolerating well at this time, still unable to follow or protect, will need to start aggressive pulmonary toiltory if patient dosent start to wake up soon.

## 2017-01-26 NOTE — Procedures (Signed)
NAME:  [place] NUMBER:  [id number]  DATE OF SERVICE:  01/25/2017  DOB:  [birth year]  SEX:  M      Date of Study:  January 25, 2017.      Status:  This is an inpatient study.     Technician:  [facility].  EEG #:  [id number].    INTERPRETATION:  This is an abnormal EEG due to the presence of bihemispheric slowing.    HISTORY:  This is a 61 year old male with a clinical concern for seizures.  The study was requested to evaluate for interictal abnormalities.    REPORT:  This is a digitally acquired EEG following the standard 10-20 system of electrode placement.  This EEG runs between 9:21 until 10:08 for a total of 47 minutes.  During this time there is bihemispheric slowing consisting of low-amplitude theta activity for the most part.  Photic stimulation was performed and did not elicit any abnormalities.    CLINICAL CORRELATION:  The EEG described above is suggestive of a nonspecific diffuse encephalopathy.  Toxic, metabolic, hypoxic and infectious causes should be considered.        Janyce Llanos, MD  Assistant Professor   [facility]          DD:  01/25/2017 11:25:40  DT:  01/26/2017 00:56:58 KG  D#:  [id number]

## 2017-01-26 NOTE — Nurses Notes (Signed)
1530: Neurosurgery called to bedside to discuss pt's neurological status and injury. Pt has been more difficult to arouse and is not following commands or withdrawing in his left or right extremities. Dr. Karsten Fells of neurosurgery to bedside to assess pt. Dr. Virgilio Frees stating that due to pt's TBI, he expects him to wax and wane. Will continue to monitor pt.     1600: SICU resident Dr. Lester Carolina notified of neurosurgery's assessment.
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
## 2017-01-26 NOTE — Nurses Notes (Signed)
01/26/17 0915   Vital Signs   Heart Rate (!) 132   Respiratory Rate (!) 22   ART BP 160/84   ART-Line  MAP 108 mmHg   Oxygen Therapy   SpO2-1 96 %   HR-SpO2 130 bpm     Pt's HR and BP increasing. Pt currently moving left upper and lower extremities spontaneously. Pt moaning out. PRN morphine administered and nicardipine gtt increased. SICU resident, Dr. Lester Carolina notified and to bedside to assess. 5 mg of Metoprolol ordered. Dr. Lester Carolina also notified regarding pt's failed bedside speech and swallow. Pt unable to cough on command, therefore, due to inability to protect airway, assessment is a fail. Orders obtained to reassess speech and swallow in a half an hour and if pt still fails, to place NG tube.

## 2017-01-26 NOTE — Nurses Notes (Signed)
Pt not following commands and is very difficult to arouse. Pt will make noise with sternal rub and will open eyes for 5 seconds. Pt moving extremities spontaneously. Dr. Lester Carolina notified and to bedside to assess. Dr. Lester Carolina updating family on pt's condition. No new orders obtained at this time. Will continue to monitor.
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
## 2017-01-26 NOTE — Nurses Notes (Signed)
01/26/17 1200   Glasgow Coma Scale   Best Eye Response 3-->(E3) to speech   Best Motor Response 6-->(M6) obeys commands   Best Verbal Response 1-->(V1) none   Glasgow Coma Scale Score 10      Pt sticking out tongue on command, but not following in extremities. Pt moving left upper and lower extremities spontaneously. Will continue to monitor.

## 2017-01-27 ENCOUNTER — Inpatient Hospital Stay (HOSPITAL_COMMUNITY): Payer: BC Managed Care – PPO

## 2017-01-27 ENCOUNTER — Encounter (HOSPITAL_COMMUNITY): Payer: Self-pay | Admitting: PULMONARY DISEASE

## 2017-01-27 DIAGNOSIS — D7289 Other specified disorders of white blood cells: Secondary | ICD-10-CM

## 2017-01-27 DIAGNOSIS — R918 Other nonspecific abnormal finding of lung field: Secondary | ICD-10-CM

## 2017-01-27 DIAGNOSIS — S8992XA Unspecified injury of left lower leg, initial encounter: Secondary | ICD-10-CM | POA: Diagnosis present

## 2017-01-27 DIAGNOSIS — R55 Syncope and collapse: Secondary | ICD-10-CM

## 2017-01-27 DIAGNOSIS — J181 Lobar pneumonia, unspecified organism: Secondary | ICD-10-CM

## 2017-01-27 DIAGNOSIS — S062X9A Diffuse traumatic brain injury with loss of consciousness of unspecified duration, initial encounter: Secondary | ICD-10-CM | POA: Diagnosis present

## 2017-01-27 DIAGNOSIS — S062XAA Diffuse traumatic brain injury with loss of consciousness status unknown, initial encounter: Secondary | ICD-10-CM | POA: Diagnosis present

## 2017-01-27 DIAGNOSIS — R000 Tachycardia, unspecified: Secondary | ICD-10-CM

## 2017-01-27 DIAGNOSIS — R509 Fever, unspecified: Secondary | ICD-10-CM

## 2017-01-27 DIAGNOSIS — I10 Essential (primary) hypertension: Secondary | ICD-10-CM

## 2017-01-27 DIAGNOSIS — S6992XA Unspecified injury of left wrist, hand and finger(s), initial encounter: Secondary | ICD-10-CM | POA: Diagnosis present

## 2017-01-27 LAB — CBC
HCT: 37.4 % (ref 36.7–47.0)
HGB: 12.8 g/dL (ref 12.5–16.3)
MCH: 30.7 pg (ref 27.4–33.0)
MCHC: 34.2 g/dL (ref 32.5–35.8)
MCV: 89.8 fL (ref 78.0–100.0)
MPV: 7 fL — ABNORMAL LOW (ref 7.5–11.5)
PLATELETS: 322 x10ˆ3/uL (ref 140–450)
RBC: 4.16 x10ˆ6/uL (ref 4.06–5.63)
RDW: 12.3 % (ref 12.0–15.0)
WBC: 18.4 x10ˆ3/uL — ABNORMAL HIGH (ref 3.5–11.0)

## 2017-01-27 LAB — URINALYSIS, MACROSCOPIC
BILIRUBIN: NEGATIVE mg/dL
COLOR: NORMAL
GLUCOSE: NEGATIVE mg/dL
KETONES: NEGATIVE mg/dL
LEUKOCYTES: NEGATIVE WBCs/uL
NITRITE: NEGATIVE
PH: 5 (ref 5.0–8.0)
PROTEIN: 30 mg/dL — AB
SPECIFIC GRAVITY: 1.02 (ref 1.005–1.030)
UROBILINOGEN: NEGATIVE mg/dL

## 2017-01-27 LAB — ECG 12-LEAD
Atrial Rate: 116 {beats}/min
Atrial Rate: 130 {beats}/min
Calculated P Axis: 77 degrees
Calculated P Axis: 72 degrees
Calculated R Axis: 39 degrees
Calculated R Axis: 44 degrees
Calculated T Axis: 78 degrees
Calculated T Axis: 75 degrees
PR Interval: 148 ms
PR Interval: 146 ms
QRS Duration: 98 ms
QRS Duration: 92 ms
QT Interval: 338 ms
QT Interval: 298 ms
QTC Calculation: 469 ms
QTC Calculation: 438 ms
Ventricular rate: 116 {beats}/min
Ventricular rate: 116 {beats}/min
Ventricular rate: 130 {beats}/min

## 2017-01-27 LAB — POC BLOOD GLUCOSE (RESULTS)
GLUCOSE, POC: 124 mg/dL — ABNORMAL HIGH (ref 70–105)
GLUCOSE, POC: 135 mg/dL — ABNORMAL HIGH (ref 70–105)
GLUCOSE, POC: 137 mg/dL — ABNORMAL HIGH (ref 70–105)
GLUCOSE, POC: 162 mg/dL — ABNORMAL HIGH (ref 70–105)

## 2017-01-27 LAB — TROPONIN-I: TROPONIN I: 63 ng/L — ABNORMAL HIGH (ref 0–30)

## 2017-01-27 LAB — BASIC METABOLIC PANEL
ANION GAP: 8 mmol/L (ref 4–13)
BUN/CREA RATIO: 18 (ref 6–22)
BUN: 12 mg/dL (ref 8–25)
BUN: 12 mg/dL (ref 8–25)
CALCIUM: 9 mg/dL (ref 8.5–10.2)
CHLORIDE: 107 mmol/L (ref 96–111)
CO2 TOTAL: 23 mmol/L (ref 22–32)
CREATININE: 0.68 mg/dL (ref 0.62–1.27)
ESTIMATED GFR: >59 mL/min/1.73mˆ2 (ref >59)
GLUCOSE: 138 mg/dL (ref 65–139)
POTASSIUM: 3.4 mmol/L — ABNORMAL LOW (ref 3.5–5.1)
SODIUM: 138 mmol/L (ref 136–145)

## 2017-01-27 LAB — PHOSPHORUS: PHOSPHORUS: 2.1 mg/dL — ABNORMAL LOW (ref 2.3–4.0)

## 2017-01-27 LAB — URINALYSIS, MICROSCOPIC
RBCS: 8 /HPF — ABNORMAL HIGH (ref <6.0)
WBCS: 1 /HPF (ref <4.0)

## 2017-01-27 LAB — MAGNESIUM: MAGNESIUM: 2.1 mg/dL (ref 1.6–2.5)

## 2017-01-27 MED ORDER — DESIPRAMINE 50 MG TABLET
50.00 mg | ORAL_TABLET | Freq: Every evening | ORAL | Status: DC
Start: 2017-01-27 — End: 2017-01-31
  Administered 2017-01-27 – 2017-01-30 (×4): 50 mg via GASTROSTOMY
  Filled 2017-01-27 (×5): qty 1

## 2017-01-27 MED ORDER — PERFLUTREN LIPID MICROSPHERES 1.1 MG/ML INTRAVENOUS SUSPENSION
0.30 mL | INTRAVENOUS | Status: DC
Start: 2017-01-28 — End: 2017-01-29

## 2017-01-27 MED ORDER — MORPHINE 4 MG/ML INTRAVENOUS CARTRIDGE
4.00 mg | CARTRIDGE | INTRAVENOUS | Status: DC | PRN
Start: 2017-01-27 — End: 2017-01-27

## 2017-01-27 MED ORDER — METOPROLOL TARTRATE 25 MG TABLET
25.00 mg | ORAL_TABLET | Freq: Two times a day (BID) | ORAL | Status: DC
Start: 2017-01-27 — End: 2017-01-28
  Administered 2017-01-27 (×2): 25 mg via GASTROSTOMY
  Filled 2017-01-27 (×4): qty 1

## 2017-01-27 MED ORDER — FAMOTIDINE 20 MG TABLET
20.00 mg | ORAL_TABLET | Freq: Two times a day (BID) | ORAL | Status: DC
Start: 2017-01-27 — End: 2017-01-31
  Administered 2017-01-27 – 2017-01-31 (×8): 20 mg via GASTROSTOMY
  Filled 2017-01-27 (×8): qty 1

## 2017-01-27 MED ORDER — ACETAMINOPHEN 325 MG TABLET
650.00 mg | ORAL_TABLET | ORAL | Status: DC | PRN
Start: 2017-01-27 — End: 2017-01-28
  Administered 2017-01-27 – 2017-01-28 (×2): 650 mg via GASTROSTOMY
  Filled 2017-01-27 (×2): qty 2

## 2017-01-27 MED ORDER — SODIUM CHLORIDE 0.9 % INTRAVENOUS SOLUTION
20.0000 mmol | Freq: Once | INTRAVENOUS | Status: AC
Start: 2017-01-27 — End: 2017-01-27
  Administered 2017-01-27: 0 mmol via INTRAVENOUS
  Administered 2017-01-27: 20 mmol via INTRAVENOUS
  Filled 2017-01-27: qty 6.67

## 2017-01-27 MED ORDER — PROPRANOLOL 40 MG TABLET
40.00 mg | ORAL_TABLET | Freq: Three times a day (TID) | ORAL | Status: DC
Start: 2017-01-27 — End: 2017-01-31
  Administered 2017-01-27 – 2017-01-31 (×12): 40 mg via GASTROSTOMY
  Filled 2017-01-27 (×13): qty 1

## 2017-01-27 MED ORDER — FENTANYL (PF) 50 MCG/ML INJECTION SOLUTION
25.0000 ug | INTRAMUSCULAR | Status: DC | PRN
Start: 2017-01-27 — End: 2017-01-29

## 2017-01-27 MED ORDER — METOPROLOL TARTRATE 5 MG/5 ML INTRAVENOUS SOLUTION
5.00 mg | INTRAVENOUS | Status: DC | PRN
Start: 2017-01-27 — End: 2017-01-28

## 2017-01-27 MED ORDER — PROPRANOLOL 20 MG TABLET
20.0000 mg | ORAL_TABLET | Freq: Three times a day (TID) | ORAL | Status: DC
Start: 2017-01-27 — End: 2017-01-27
  Administered 2017-01-27: 20 mg via GASTROSTOMY
  Filled 2017-01-27 (×2): qty 1

## 2017-01-27 MED ORDER — OXYCODONE 20 MG/ML ORAL CONCENTRATE
5.00 mg | ORAL | Status: DC | PRN
Start: 2017-01-27 — End: 2017-01-27
  Administered 2017-01-27: 5 mg via GASTROSTOMY
  Filled 2017-01-27: qty 0.5

## 2017-01-27 MED ORDER — ACETAMINOPHEN 325 MG TABLET
650.00 mg | ORAL_TABLET | ORAL | Status: DC | PRN
Start: 2017-01-27 — End: 2017-01-27
  Administered 2017-01-27: 650 mg via ORAL
  Filled 2017-01-27: qty 2

## 2017-01-27 MED ORDER — POTASSIUM, SODIUM PHOSPHATES 280 MG-160 MG-250 MG ORAL POWDER PACKET
1.00 | Freq: Three times a day (TID) | ORAL | Status: DC
Start: 2017-01-27 — End: 2017-01-29
  Administered 2017-01-27 – 2017-01-29 (×6): 1 via GASTROSTOMY
  Filled 2017-01-27 (×8): qty 1

## 2017-01-27 MED ORDER — ENOXAPARIN 30 MG/0.3 ML SUBCUTANEOUS SYRINGE
30.0000 mg | INJECTION | Freq: Two times a day (BID) | SUBCUTANEOUS | Status: DC
Start: 2017-01-27 — End: 2017-02-12
  Administered 2017-01-27 – 2017-02-10 (×28): 30 mg via SUBCUTANEOUS
  Administered 2017-02-10: 0 mg via SUBCUTANEOUS
  Administered 2017-02-10 – 2017-02-12 (×4): 30 mg via SUBCUTANEOUS
  Filled 2017-01-27 (×34): qty 0.3

## 2017-01-27 MED ORDER — METOPROLOL TARTRATE 5 MG/5 ML INTRAVENOUS SOLUTION
5.0000 mg | Freq: Once | INTRAVENOUS | Status: AC
Start: 2017-01-27 — End: 2017-01-27
  Administered 2017-01-27: 5 mg via INTRAVENOUS

## 2017-01-27 MED ORDER — MAGNESIUM HYDROXIDE 400 MG/5 ML ORAL SUSPENSION
15.00 mL | ORAL | Status: DC
Start: 2017-01-30 — End: 2017-01-28

## 2017-01-27 MED ORDER — INSULIN REGULAR HUMAN 100 UNIT/ML INJECTION SSIP
2.00 [IU] | INJECTION | Freq: Four times a day (QID) | SUBCUTANEOUS | Status: DC | PRN
Start: 2017-01-27 — End: 2017-02-03
  Administered 2017-01-27 – 2017-02-01 (×5): 2 [IU] via SUBCUTANEOUS
  Filled 2017-01-27: qty 3

## 2017-01-27 MED ORDER — NUTRITION PROTEIN SUPPLEMENT - TUBE FEED
2.0000 | Freq: Two times a day (BID) | Status: DC
Start: 2017-01-27 — End: 2017-01-31
  Administered 2017-01-27 – 2017-01-31 (×8): 30 g via GASTROSTOMY

## 2017-01-27 MED ORDER — ALBUTEROL SULFATE 2.5 MG/3 ML (0.083 %) SOLUTION FOR NEBULIZATION
2.50 mg | INHALATION_SOLUTION | Freq: Four times a day (QID) | RESPIRATORY_TRACT | Status: DC
Start: 2017-01-27 — End: 2017-01-31
  Administered 2017-01-27 – 2017-01-30 (×11): 2.5 mg via RESPIRATORY_TRACT
  Administered 2017-01-30: 0 mg via RESPIRATORY_TRACT
  Administered 2017-01-30: 2.5 mg via RESPIRATORY_TRACT
  Filled 2017-01-27 (×5): qty 3
  Filled 2017-01-27: qty 18
  Filled 2017-01-27 (×6): qty 3

## 2017-01-27 MED ORDER — OXYCODONE 20 MG/ML ORAL CONCENTRATE
5.00 mg | ORAL | Status: DC
Start: 2017-01-27 — End: 2017-01-29
  Administered 2017-01-27 – 2017-01-28 (×8): 5 mg via GASTROSTOMY
  Administered 2017-01-29 (×2): 0 mg via GASTROSTOMY
  Administered 2017-01-29: 5 mg via GASTROSTOMY
  Filled 2017-01-27 (×9): qty 0.5

## 2017-01-27 MED ADMIN — lanthanum 500 mg chewable tablet: GASTROSTOMY | @ 15:00:00

## 2017-01-27 MED ADMIN — levETIRAcetam 500 mg tablet: GASTROSTOMY | @ 11:00:00

## 2017-01-27 MED ADMIN — sodium chloride 0.9 % intravenous solution: GASTROSTOMY | @ 20:00:00 | NDC 00338004904

## 2017-01-27 MED ADMIN — collagenase clostridium histolyticum 250 unit/gram topical ointment: GASTROSTOMY | @ 20:00:00 | NDC 50484001030

## 2017-01-27 MED ADMIN — lanolin-oxyquin-pet, hydrophil topical ointment: @ 06:00:00 | NDC 09999989257

## 2017-01-27 NOTE — Care Plan (Signed)
Order received and chart reviewed. RN requesting PT and OT to hold evaluations at this time stating pt recently returned to bed. OT to follow up at later time as appropriate.     Natasha Mead, OTR/L  Pager: [phone]

## 2017-01-27 NOTE — Care Plan (Signed)
Problem: Patient Care Overview (Adult,OB)  Goal: Plan of Care Review(Adult,OB)  The patient and/or their representative will communicate an understanding of their plan of care   Outcome: Ongoing (see interventions/notes)  Head bleeds stable per CT. Diffuse axonal causes pt neuro status to wax/wane. Pt low fall risk since he is restrained. No sign of injury from restraints. L wrist and knee dressing being changed by ortho. Continuing to manage BP with nicardipine gtt and HR with prn metoprolol.   Goal: Individualization/Patient Specific Goal(Adult/OB)  Outcome: Ongoing (see interventions/notes)    Goal: Interdisciplinary Rounds/Family Conf  Outcome: Ongoing (see interventions/notes)      Problem: Non-violent/Non-Self Destructive Restraints  Goal: Alternative methods tried prior to restraints  Outcome: Ongoing (see interventions/notes)    Goal: Patient free from injury and discomfort  Outcome: Ongoing (see interventions/notes)    Goal: Autonomy maintained at the highest possible level  Outcome: Ongoing (see interventions/notes)    Goal: Need for restraints reassessed per policy  Outcome: Ongoing (see interventions/notes)    Goal: Patient education provided  Outcome: Ongoing (see interventions/notes)    Goal: Problem Interventions  Outcome: Ongoing (see interventions/notes)      Problem: Skin Injury Risk (Adult,Obstetrics,Pediatric)  Goal: Skin Health and Integrity  Patient will demonstrate the desired outcomes by discharge/transition of care.   Outcome: Ongoing (see interventions/notes)      Problem: Ventilation, Mechanical Invasive (Adult)  Prevent and manage potential problems including:  1. artificial airway induced skin/tissue breakdown  2. gastritis/stress ulcer  3. immobility  4. inability to wean  5. malnutrition  6. mechanical dysfunction  7. situational response  8. ventilator-induced lung injury   Goal: Signs and Symptoms of Listed Potential Problems Will be Absent, Minimized or Managed (Ventilation,  Mechanical Invasive)  Signs and symptoms of listed potential problems will be absent, minimized or managed by discharge/transition of care (reference Ventilation, Mechanical Invasive (Adult) CPG).    Outcome: Completed Date Met: 01/25/17      Problem: Fall Risk (Adult)  Goal: Identify Related Risk Factors and Signs and Symptoms  Related risk factors and signs and symptoms are identified upon initiation of Human Response Clinical Practice Guideline (CPG).   Outcome: Ongoing (see interventions/notes)    Goal: Absence of Falls  Patient will demonstrate the desired outcomes by discharge/transition of care.   Outcome: Ongoing (see interventions/notes)      Problem: Brain Injury, Severe Traumatic (GCS 8 or less) (Adult)  Prevent and manage potential problems including:  1. acute neurologic deterioration  2. embolism  3. fluid/electrolyte imbalance  4. functional deficit/sensory impairment/immobility  5. gastrointestinal complications  6. hemodynamic instability  7. hypercatabolism  8. hyperglycemia  9. hypoxia/hypoxemia  10. infection  11. pain  12. situational response  13. skin breakdown   Goal: Signs and Symptoms of Listed Potential Problems Will be Absent, Minimized or Managed (Brain Injury, Severe Traumatic)  Signs and symptoms of listed potential problems will be absent, minimized or managed by discharge/transition of care (reference Brain Injury, Severe Traumatic (GCS 8 or less) (Adult) CPG).   Outcome: Ongoing (see interventions/notes)

## 2017-01-27 NOTE — Progress Notes (Addendum)
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 possible diffuse axonal injury on CT scan.   Additionally he has open left wrist and left knee joint that was washed out by orthopedic surgery.      NEURO:  GCS: E1=None (Does Not Open Eyes) M4=Withdraws To Pain V1 via my exam this am however overnight nurse reports that his GCS waxes and wanes. Best being [id number]    Imaging:   CT brain January 24, 2017  Hypodensities within the  left parietal lobe as described. The setting of  trauma, these may represent parenchymal bleeds possibly related to diffuse  axonal injury. Another consideration would be hyperdense metastases. Small  quantity of intraventricular hemorrhage.    CT brain January 25, 2017.  12 hr. follow-up  Interval increase in density of several foci of LEFT sided intraparenchymal  appearing hemorrhage. There is also demonstration of what likely represents  hemorrhagic extension within bilateral occipital horns. No hydrocephalus.    CT brain April 21st 2018 12 noon   Stable hemorrhage    MRI brain C-spine:   Multiple hemorrhagic contusions are present as described above with a  pattern suggesting axonal injury.  1.No findings suggestive of traumatic injury to the cord or ligaments.  2. Extensive discogenic and uncovertebral degenerative changes especially  in the C5-6 and C6-7 levels. Grade 1 anterior listhesis C3 on C4  with  advanced left-sided facet degenerative changes are appreciated.    CTA extracranial  1. No evidence of vascular injury, although the extracranial distal  vertebral and carotid arteries are slightly limited due to streak artifact  from dental hardware.  2. Left first-third posterior rib fractures.    EEG 01/24/17  This is a digitally acquired EEG following the standard 10-20 system of electrode placement.  This EEG runs between 9:21 until 10:08 for a total of 47 minutes.  During this time there is bihemispheric slowing consisting of low-amplitude theta activity for the most part.  Photic stimulation was performed and did not elicit any abnormalities.    Sz prophylaxis: Keppra 500 mg BID x7 days   Sedation/analgesia: Fentanyl p.r.n., morphine p.r.n., right oxycodone p.r.n.  Neurochecks Q1H  Pupil checks Q 1 hr  Goal SBP < 160 mmHg, nicardipine started overnight to maintain systolic blood pressures less than 160 after extubation and for vasospasm     CARDIOVASCULAR:  Systolic (89QJJ), HER:740 , Min:115 , CXK:481     Diastolic (85UDJ), SHF:02, Min:60, Max:95     CVP: 4 MM HG  ART-Line  MAP: 91 mmHg   Troponins:  No results for input(s): TROPONINI, CPK, CKMB in the last 72 hours.    Meds: Nicardipine patients tachycardia in 120's appears ST. Responds intermittently to metoprolol and labetalol overnight. Plan to get NG tube in this morning if unable to pass bedside swallow. then start 25 metoprolol BID, will decrease nicardipine drip as needed     Pressors: None       PULMONARY:   Airway Ventilator Settings   EndoTracheal Tube 8.0 26 cm Lip (Active)   Airway Secure Device 01/25/2017  8:16 AM   Position Change Yes 01/25/2017  8:16 AM   Change Reason Routine 01/25/2017  8:16 AM   Retaped N 01/24/2017 11:10 AM    Not on a ventilator   SpO2  Avg: 93.8 %  Min: 91 %  Max: 97 %  Blood Gas: 01/26/2017   Ph 7.41, PCO2 43.0, PO2 68, HCO3 26.6, BE 2.3  No results found for this encounter  Nebs:  Albuterol 4 times daily, Scheduled    Imaging: CXR without evidence of acute emergent pathology unchanged from previous 01/26/17.  Per my read    GI:  Diet: MNT PROTOCOL FOR DIETITIAN  DIET NPO - NOW EXCEPT ALL MEDS WITH SIPS OF WATER  ADULT TUBE FEEDING - CONTINUOUS DRIP    NO MEALS, TF ONLY; OSMOLITE 1.5; OG; Initial Rate (ml/hr): 10; Increase by: 10 ml q4h; Goal Rate (ml/hr): 30   Recent Labs  01/24/17   1333   BILIRUBIN  Negative     Last BM: Last Bowel Movement:  (pta)   Tube feeds: Osmolite 1.5 goal 30 mL per hour  NG 140, 320 this am   Prophylaxis: Pepcid  Bowel regimen: Senokot, colace  NGT placed 4/22    RENAL/GU:  Recent Labs      01/25/17   0031   01/25/17   0556  01/25/17   1253  01/26/17   0029  01/26/17   1819  01/27/17   0035   SODIUM  143   < >  140  142  140  141   --   138   POTASSIUM  4.1   --    --    --   3.7   --   3.4*   CHLORIDE  113*   < >  115*  113*  112*  110   --   107   BICARBONATE   --    < >  23.7  24.1  26.6*   --    --    BUN  14   --    --    --   11   --   12   CREATININE  0.79   --    --    --   0.62   --   0.68   GLUCOSE   --    < >  126*  121*  130*   --    --    ANIONGAP  8   --    --    --   6   --   8   CALCIUM  8.4*   --    --    --   8.5   --   9.0   MAGNESIUM  1.7   --    --    --   1.9   --   2.1   PHOSPHORUS  2.7   --    --    --   1.7*  2.4  2.1*    < > = values in this interval not displayed.         Intake/Output Summary (Last 24 hours) at 01/27/17 0709  Last data filed at 01/27/17 0700   Gross per 24 hour   Intake          3411.63 ml   Output             3045 ml   Net           366.63 ml     Foley: yes  Foley in critically ill pt for strict I/O's  UO over last 24 hours 1.51 cc/kg/hr (50-200 mL/hr) Net +2.0 L    IV fluids: NS @ 100 mL/hr  Diuretics:  None       Urology foley      HEME:  Recent Labs      01/24/17   1057  01/24/17   1432   01/25/17   0031   01/25/17   1055  01/26/17   0029  01/27/17   0035   HGB  16.7*  15.5   < >  13.3   < >  12.2*  12.1*  12.8   HCT  48.8*  45.8   < >  38.6   < >   35.7*  35.7*  37.4   PLTCNT  441  429   < >  342   --    --   274  322   APTT  26.9  24.6*   --    --    --    --    --    --    INR  1.01  1.03   --    --    --    --    --    --     < > = values in this interval not displayed.     Transfusions: none   Prophylaxis: SCD. No chemo given IPH   Serial hemoglobin studies have remained stable 12.4, 12.2, 12.1  Trend hemoglobin Q4 hours given concern for iliac vessel injury      ID:  Temp (24hrs) Max:38 C (100.4 F)    Recent Labs      01/24/17   1057   01/25/17   0031  01/26/17   0029  01/27/17   0035   WBC  15.0*   < >  18.6*  18.7*  18.4*   PMNS  77   --    --    --    --     < > = values in this interval not displayed.     Blood cultures:  None  Urine cultures:  None  BAL:  Pending collection  OR cultures:  None    ABX:    Ancef completed    ENDO:  No results for input(s): GLUCOSEPOC in the last 24 hours.   Recent Labs      01/25/17   1103  01/26/17   0032  01/26/17   0552  01/26/17   1225  01/26/17   1816  01/27/17   0038   GLUIP  125*  122*  133*  137*  119*  135*   SSI      MSK:  Fractures:  Ribs 1 through 4 left  Ribs 8 and 9 left  Rib fracture protocol  Possible osteochondral fracture of the little medial femoral condyle    Went to operating room on January 16, 2017 with Orthopedic surgery for I and D left knee and left wrist  WBAT LLE and BLUE       OTHER:  Activity: Bedrest, HOB elevated 30 degrees  PT/OT: Ordered  MNT: Ordered  Lines:   LDA   Peripheral IV Left Median Cubital  (antecubital fossa) (Active)   Line Status flushed without difficulty;blood return not present;intermittent infusion cap intact (saline/heplock) 01/25/2017  8:00 AM   Type of Fluids Infusing none 01/25/2017  8:00 AM   Site Assessment WDL 01/25/2017  8:00 AM   Phlebitis Scale 0 01/25/2017  8:00 AM   Infiltration Scale 0 01/25/2017  8:00 AM   Dressing Type Transparent 01/25/2017  8:00 AM   Dressing Status Intact 01/25/2017  8:00 AM   Dressing Drainage None 01/25/2017  8:00 AM   Daily Site  Maintenance & Management Met 01/25/2017  8:00 AM   Date Dressing Changed 01/24/17 01/25/2017  4:00 AM       Peripheral IV Right Median Cubital  (antecubital fossa) (Active)   Line Status flushed without difficulty;blood return not present;intermittent infusion cap intact (saline/heplock) 01/25/2017  8:00 AM   Type of Fluids Infusing none 01/25/2017  8:00 AM   Site Assessment WDL 01/25/2017  8:00 AM   Phlebitis Scale 0 01/25/2017  8:00 AM   Infiltration Scale 0 01/25/2017  8:00 AM   Dressing Type Transparent 01/25/2017  8:00  AM   Dressing Status Intact 01/25/2017  8:00 AM   Dressing Drainage None 01/25/2017  8:00 AM   Daily Site Maintenance & Management Met 01/25/2017  8:00 AM   Date Dressing Changed 01/24/17 01/25/2017  4:00 AM       Central Triple Lumen Right Internal Jugular (Active)   Proximal Lumen Status unable to flush, lumen marked;intermittent infusion cap intact (saline/heplock) 01/25/2017  8:00 AM   Medial 1 Lumen Status flushed without difficulty;blood return present;infusing 01/25/2017  8:00 AM   Distal Lumen Status flushed without difficulty;blood return present;monitoring CVP 01/25/2017  8:00 AM   Type of Fluids Infusing see MAR 01/25/2017  8:00 AM   Site Assessment WDL 01/25/2017  8:00 AM   Infiltration Scale 0 01/25/2017  8:00 AM   Extremity Check Fingers;Pink 01/25/2017  8:00 AM   Port WaveForm Not Applicable 1/74/9449  6:75 AM   Distal Port WaveForm Zeroed (Calibrated);Appropriate Wave Forms 01/25/2017  8:00 AM   Dressing Type Transparent 01/25/2017  8:00 AM   Dressing Status Intact 01/25/2017  8:00 AM   Dressing Drainage None 01/25/2017  8:00 AM   Evaluation of Need Emergency/Critical Access 01/25/2017  8:00 AM   Daily Site Maintenance & Management Met 01/25/2017  8:00 AM   Dressing Change Interventions Met 01/25/2017  8:00 AM   Date Dressing Changed 01/24/17 01/25/2017  4:00 AM       Arterial Line Right Radial (Active)   Line Status flushed without difficulty;blood return present;infusing;to transducer 01/25/2017  8:00 AM    Type of Fluids Infusing NS 01/25/2017  8:00 AM   Port WaveForm Zeroed (Calibrated);Appropriate Wave Forms 01/25/2017  8:00 AM   Blood Return Yes 01/25/2017  8:00 AM   Extremity Check Fingers;Pink 01/25/2017  8:00 AM   Site Condition WDL 01/25/2017  8:00 AM   Dressing Type Transparent 01/25/2017  8:00 AM   Dressing Status Intact 01/25/2017  8:00 AM   Dressing Drainage None 01/25/2017  8:00 AM       Oral Gastric Tube Right (Active)   Tube Status Clamped 01/25/2017  8:00 AM   Site Status P;S 01/25/2017  8:00 AM   Placement Check Other (Comment) 01/25/2017  8:00 AM   Intake - Volume infused 30 mL 01/24/2017  8:00 PM       Foley Catheter (Active)   Catheter To Urometer 01/25/2017  8:00 AM   Catheter Secured Yes 01/25/2017  8:00 AM   Urine Color Clear;Yellow 01/25/2017  8:00 AM   Output 40 01/25/2017  9:00 AM       EndoTracheal Tube 8.0 26 cm Lip (Active)   Airway Secure Device 01/25/2017  8:16 AM   Position Change Yes 01/25/2017  8:16 AM   Change Reason Routine 01/25/2017  8:16 AM   Retaped N 01/24/2017 11:10 AM              PLAN:  - Pan culture due to fever and leukocytosis  - Tube feeds started via NG tube, goal of 30 mL/hr  - Continue to monitor GCS  - Start propanolol 20 mg TID   - d/c IVF  - d/c nicardipine gtt  - d/c Morphine and schedule oxicodone  - Maintenance HFNC      Roderic Ovens, MD  01/27/2017, 07:24     [facility]est[facility]     PGY2/CA-1 Resident     Pager # [phone]  I saw and examined the patient.  I  reviewed the findings and documentation of the Resident .  I agree with the findings and plan of care as documented in the Resident note.  Any exceptions/additions are edited/noted.    Work up fever and leukocytosis  Pain control  Advance tube feeds to goal  DC IV nicardipine gtt  Fever, tachycardia - likely neurostorming will work up organic causes      Critical Care Attestation    I was present at the bedside of this critically ill patient for 60 minutes  exclusive of procedures.  This patient suffers from failure or dysfunction of Neurologic/Sensory system(s).  The care of this patient was in regard to managing (a) conditions(s) that has a high probability of sudden, clinically significant, or life-threatening deterioration and required a high degree of Attending Physician attention and direct involvement to intervene urgently. Data review and care planning was performed in direct proximity of the patient, examination was obviously performed in direct contact with the patient. All of this time was exclusive of procedure which will be documented elsewhere in the chart.    My critical care time involved full attention to the patients' condition and included:    Review of nursing notes and/or old charts  Review of medications, allergies, and vital signs  Documentation time  Consultant collaboration on findings and treatment options  Care, transfer of care, and discharge plans  Ordering, interpreting, and reviewing diagnostic studies/tab tests  Obtaining necessary history from family, EMS, nursing home staff and/or treating physicians    My critical care time did not include time spent teaching resident physician(s) or other services of resident physicians, or performing other reported procedures.  Total Critical Care Time: 60 minutes    Mila Palmer, MD  Assistant Professor of Surgery  Trauma, Surgical Critical Care, and [place]  02/01/2017, 18:37

## 2017-01-27 NOTE — Respiratory Therapy (Signed)
Spoke with Dr. Margaretmary Eddy, patient unable to follow to complete FVC/NIF+PEP.

## 2017-01-27 NOTE — Care Plan (Signed)
Physical Therapy Progress Note        PT ordered received and chart reviewed. Attempted to see pt for this PM for evaluation, RN asking to hold at this time as they just got patient back to bed. Will follow up with pt as appropriate. Thank you for the consult, please contact with any questions or concerns.    Rexford Maus, DPT, PT.     Pager [phone]

## 2017-01-27 NOTE — Progress Notes (Signed)
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 2017  Hypodensities within the left parietal lobe as described. The settingof  trauma, these may represent parenchymal bleeds possibly related to diffuse axonal injury. Another consideration would be hyperdense metastases. Small quantity of intraventricular hemorrhage.    CT brain January 25, 2017.  12 hr. follow-up  Interval increase in density of several foci of LEFT sided  intraparenchymal appearing hemorrhage. There is also demonstration of what likely represents hemorrhagic extension within bilateral occipital horns. No hydrocephalus.    CT brain April 21st 2018 12 noon   Stable hemorrhage    MRI brain C-spine:   Multiple hemorrhagic contusions are present as described above with a  pattern suggesting axonal injury.  1.No findings suggestive of traumatic injury to the cord or ligaments.  2. Extensive discogenic and uncovertebral degenerative changes especially  in the C5-6 and C6-7 levels. Grade 1 anterior listhesis C3 on C4 with  advanced left-sided facet degenerative changes are appreciated.    CTA extracranial  1. No evidence of vascular injury, although the extracranial distal  vertebral and carotid arteries are slightly limited due to streak artifact  from dental hardware.  2. Left first-third posterior rib fractures.    EEG 01/24/17  This is a digitally acquired  EEG following the standard 10-20 system of electrode placement.  This EEG runs between 9:21 until 10:08 for a total of 47 minutes.  During this time there is bihemispheric slowing consisting of low-amplitude theta activity for the most part.  Photic stimulation was performed and did not elicit any abnormalities.    Sz prophylaxis: Keppra 500 mg BID x7 days   Sedation/analgesia: Fentanyl p.r.n., morphine p.r.n., right oxycodone p.r.n.  Neurochecks Q1H  - Q2  Pupil checks Q 1 hr  Goal SBP < 160 mmHg, nicardipine started overnight to maintain systolic blood pressures less than 160 after extubation and for vasospasm     CARDIOVASCULAR:  Systolic (79GXQ), JJH:417 , Min:115 , EYC:144     Diastolic (81EHU), DJS:97, Min:60, Max:95     CVP: 4 MM HG  ART-Line  MAP: 91 mmHg   Troponins:  No results for input(s): TROPONINI, CPK, CKMB in the last 72 hours.    ECHO pending    Meds: Propranolol 40 TID  D/C metoprolol 5 mg Q 4 hours prn     Pressors: None       PULMONARY:   Airway Ventilator Settings   EndoTracheal Tube 8.0 26 cm Lip (Active)   Airway Secure Device 01/25/2017  8:16 AM   Position Change Yes 01/25/2017  8:16 AM   Change Reason Routine 01/25/2017  8:16 AM   Retaped N 01/24/2017 11:10 AM    Not on a ventilator   SpO2  Avg: 93.8 %  Min: 91 %  Max: 97 %  Blood Gas: 01/26/2017   Ph 7.41, PCO2 43.0, PO2 68, HCO3 26.6, BE 2.3  No results found for this encounter  Nebs:  Albuterol 4 times daily, Scheduled   Imaging: CXR stable, unchanged from 4/22 and 4/23 per my read    GI:  Diet: MNT PROTOCOL FOR DIETITIAN  DIET NPO - NOW EXCEPT ALL MEDS WITH SIPS OF WATER  ADULT TUBE FEEDING - CONTINUOUS DRIP    NO MEALS, TF ONLY; OSMOLITE 1.5; OG; Initial Rate (ml/hr): 10; Increase by: 10 ml q4h; Goal Rate (ml/hr): 30   Recent Labs      01/24/17   1333   BILIRUBIN  Negative     Last BM: Last Bowel Movement:  (pta)   Tube feeds: Osmolite 1.5 goal 65 mL per hour  Prophylaxis: Pepcid  Bowel regimen: Senokot, colace  NGT placed  4/22    RENAL/GU:  Recent Labs      01/25/17   0031   01/25/17   0556  01/25/17   1253  01/26/17   0029  01/26/17   1819  01/27/17   0035   SODIUM  143   < >  140  142  140  141   --   138   POTASSIUM  4.1   --    --    --   3.7   --   3.4*   CHLORIDE  113*   < >  115*  113*  112*  110   --   107   BICARBONATE   --    < >  23.7  24.1  26.6*   --    --    BUN  14   --    --    --   11   --   12   CREATININE  0.79   --    --    --   0.62   --   0.68   GLUCOSE   --    < >  126*  121*  130*   --    --    ANIONGAP  8   --    --    --   6   --   8   CALCIUM  8.4*   --    --    --   8.5   --   9.0   MAGNESIUM  1.7   --    --    --   1.9   --   2.1   PHOSPHORUS  2.7   --    --    --   1.7*  2.4  2.1*    < > = values in this interval not displayed.         Intake/Output Summary (Last 24 hours) at 01/27/17 0709  Last data filed at 01/27/17 0700   Gross per 24 hour   Intake          3411.63 ml   Output             3045 ml   Net           366.63 ml     Foley: yes  UO over last 24 hours 1.12 cc/kg/hr, 0.5 mL/kg/hr this am.   2450 out total    IV fluids: None   Diuretics:  None     HEME:  Recent Labs      01/24/17   1057  01/24/17   1432   01/25/17   0031   01/25/17   1055  01/26/17   0029  01/27/17   0035   HGB  16.7*  15.5   < >  13.3   < >  12.2*  12.1*  12.8   HCT  48.8*  45.8   < >  38.6   < >  35.7*  35.7*  37.4   PLTCNT  441  429   < >  342   --    --   274  322   APTT  26.9  24.6*   --    --    --    --    --    --    INR  1.01  1.03   --    --    --    --    --    --     < > = values in this interval not displayed.     Transfusions: none   Prophylaxis: SCD. No chemo given IPH   Serial  hemoglobin studies have remained stable 12.4, 12.2, 12.1  Trend hemoglobin Q4 hours given concern for iliac vessel injury      ID:  Temp (24hrs) Max:38 C (100.4 F)    Recent Labs      01/24/17   1057   01/25/17   0031  01/26/17   0029  01/27/17   0035   WBC  15.0*   < >  18.6*  18.7*  18.4*   PMNS  77   --    --    --    --     < > =  values in this interval not displayed.     Blood cultures:  Pending  Urine cultures:  Pending   OR cultures:  None    ABX:    Ancef completed    ENDO:  No results for input(s): GLUCOSEPOC in the last 24 hours.   Recent Labs      01/25/17   1103  01/26/17   0032  01/26/17   0552  01/26/17   1225  01/26/17   1816  01/27/17   0038   GLUIP  125*  122*  133*  137*  119*  135*   SSI      MSK:  Fractures:  Ribs 1 through 4 left  Ribs 8 and 9 left  Rib fracture protocol  Possible osteochondral fracture of the little medial femoral condyle    Went to operating room on January 16, 2017 with Orthopedic surgery for I and D left knee and left wrist  WBAT LLE and BLUE       OTHER:  Activity: Bedrest, HOB elevated 30 degrees  PT/OT: Ordered  MNT: Ordered  Lines:   LDA   Peripheral IV Left Median Cubital  (antecubital fossa) (Active)   Line Status flushed without difficulty;blood return not present;intermittent infusion cap intact (saline/heplock) 01/25/2017  8:00 AM   Type of Fluids Infusing none 01/25/2017  8:00 AM   Site Assessment WDL 01/25/2017  8:00 AM   Phlebitis Scale 0 01/25/2017  8:00 AM   Infiltration Scale 0 01/25/2017  8:00 AM   Dressing Type Transparent 01/25/2017  8:00 AM   Dressing Status Intact 01/25/2017  8:00 AM   Dressing Drainage None 01/25/2017  8:00 AM   Daily Site Maintenance & Management Met 01/25/2017  8:00 AM   Date Dressing Changed 01/24/17 01/25/2017  4:00 AM       Peripheral IV Right Median Cubital  (antecubital fossa) (Active)   Line Status flushed without difficulty;blood return not present;intermittent infusion cap intact (saline/heplock) 01/25/2017  8:00 AM   Type of Fluids Infusing none 01/25/2017  8:00 AM   Site Assessment WDL 01/25/2017  8:00 AM   Phlebitis Scale 0 01/25/2017  8:00 AM   Infiltration Scale 0 01/25/2017  8:00 AM   Dressing Type Transparent 01/25/2017  8:00 AM   Dressing Status Intact 01/25/2017  8:00 AM   Dressing Drainage None 01/25/2017  8:00 AM   Daily Site Maintenance & Management Met 01/25/2017   8:00 AM   Date Dressing Changed 01/24/17 01/25/2017  4:00 AM       Central Triple Lumen Right Internal Jugular (Active)   Proximal Lumen Status unable to flush, lumen marked;intermittent infusion cap intact (saline/heplock) 01/25/2017  8:00 AM   Medial 1 Lumen Status flushed without difficulty;blood return present;infusing 01/25/2017  8:00 AM   Distal Lumen Status flushed without difficulty;blood return present;monitoring CVP 01/25/2017  8:00 AM   Type of Fluids  Infusing see MAR 01/25/2017  8:00 AM   Site Assessment WDL 01/25/2017  8:00 AM   Infiltration Scale 0 01/25/2017  8:00 AM   Extremity Check Fingers;Pink 01/25/2017  8:00 AM   Port WaveForm Not Applicable 3/35/3317  4:09 AM   Distal Port WaveForm Zeroed (Calibrated);Appropriate Wave Forms 01/25/2017  8:00 AM   Dressing Type Transparent 01/25/2017  8:00 AM   Dressing Status Intact 01/25/2017  8:00 AM   Dressing Drainage None 01/25/2017  8:00 AM   Evaluation of Need Emergency/Critical Access 01/25/2017  8:00 AM   Daily Site Maintenance & Management Met 01/25/2017  8:00 AM   Dressing Change Interventions Met 01/25/2017  8:00 AM   Date Dressing Changed 01/24/17 01/25/2017  4:00 AM       Arterial Line Right Radial (Active)   Line Status flushed without difficulty;blood return present;infusing;to transducer 01/25/2017  8:00 AM   Type of Fluids Infusing NS 01/25/2017  8:00 AM   Port WaveForm Zeroed (Calibrated);Appropriate Wave Forms 01/25/2017  8:00 AM   Blood Return Yes 01/25/2017  8:00 AM   Extremity Check Fingers;Pink 01/25/2017  8:00 AM   Site Condition WDL 01/25/2017  8:00 AM   Dressing Type Transparent 01/25/2017  8:00 AM   Dressing Status Intact 01/25/2017  8:00 AM   Dressing Drainage None 01/25/2017  8:00 AM       Oral Gastric Tube Right (Active)   Tube Status Clamped 01/25/2017  8:00 AM   Site Status P;S 01/25/2017  8:00 AM   Placement Check Other (Comment) 01/25/2017  8:00 AM   Intake - Volume infused 30 mL 01/24/2017  8:00 PM       Foley Catheter (Active)   Catheter To Urometer  01/25/2017  8:00 AM   Catheter Secured Yes 01/25/2017  8:00 AM   Urine Color Clear;Yellow 01/25/2017  8:00 AM   Output 40 01/25/2017  9:00 AM       EndoTracheal Tube 8.0 26 cm Lip (Active)   Airway Secure Device 01/25/2017  8:16 AM   Position Change Yes 01/25/2017  8:16 AM   Change Reason Routine 01/25/2017  8:16 AM   Retaped N 01/24/2017 11:10 AM              PLAN:  - Discuss discontinuing the foley  - D/C arterial line and central line  - Provide additional bowel aids  - Increase tylenol, add arctic son for temp >38  - D/C metoprolol  - F/u blood cultures   - Continue tube feeds _0  mL/hr    Roderic Ovens, MD  01/27/2017, 07:24     [facility] of Anest[facility]     PGY2/CA-1 Resident     Pager # [phone]        Trauma/Surgical Critical Care/Acute Care Surgery Staff  Late entry for 01/27/17.  I saw and examined the patient.  I reviewed the findings and documentation of the Resident .  I agree with the findings and plan of care as documented in the Resident note.  Any exceptions/additions are edited/noted.        Critical Care Attestation    I was present at the bedside of this critically ill patient for 30 minutes exclusive of procedures.  This patient suffers from failure or dysfunction of Neurologic/Sensory, Cardiovascular and Pulmonary system(s).  The care of this patient was in regard to managing (a) conditions(s) that has a high probability of sudden, clinically significant, or life-threatening deterioration and required a high degree of Attending Physician attention and direct  involvement to intervene urgently. Data review and care planning was performed in direct proximity of the patient, examination was obviously performed in direct contact with the patient. All of this time was exclusive of procedure which will be documented elsewhere in the chart.    My critical care time involved full attention to the patients' condition and included:    Review of nursing notes and/or old charts  Review of medications, allergies, and  vital signs  Documentation time  Consultant collaboration on findings and treatment options  Care, transfer of care, and discharge plans  Ordering, interpreting, and reviewing diagnostic studies/tab tests  Obtaining necessary history from family, EMS, nursing home staff and/or treating physicians    My critical care time did not include time spent teaching resident physician(s) or other services of resident physicians, or performing other reported procedures.  Total Critical Care Time: 30 minutes    Mila Palmer, MD  Assistant Professor of Surgery  Trauma, Surgical Critical Care, and [place]  02/15/2017, 14:27
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
## 2017-01-27 NOTE — Ancillary Notes (Signed)
SBIRT    SBIRT not completed at this time due to patient's mental status - dX3 and not speaking.  Pending recommendations:  Attempt to complete SBIRT at later time/date.    Peyton Bottoms, [facility] Clinical Therapist 01/27/2017, 08:28  Pager  [phone]
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
## 2017-01-27 NOTE — Nurses Notes (Signed)
01/27/17 0300   Vital Signs   Heart Rate (!) 135   0320: Pt tachychardic since 0300. SICU [facility] notified. Ordering an ECG. No plans to give another dose of prn metoprolol since last dose was given at 0100 and is q6. Will notify again if HR >140
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
## 2017-01-27 NOTE — Care Plan (Signed)
Problem: Patient Care Overview (Adult,OB)  Goal: Plan of Care Review(Adult,OB)  The patient and/or their representative will communicate an understanding of their plan of care   Outcome: Ongoing (see interventions/notes)  Patient was up in the chair for a few hours this morning. Today patient has been hypertensive, tachypnic, and tachycardic. PRN metoprolol was giving throughout the day to attempt to keep the patient within parameters. Nicardipine gtt was turned off. Planned to manage with betablockers. Family at bedside throughout day. Will continue to monitor the patient.   Goal: Individualization/Patient Specific Goal(Adult/OB)  Outcome: Ongoing (see interventions/notes)    Goal: Interdisciplinary Rounds/Family Conf  Outcome: Ongoing (see interventions/notes)      Problem: Skin Injury Risk (Adult,Obstetrics,Pediatric)  Goal: Skin Health and Integrity  Patient will demonstrate the desired outcomes by discharge/transition of care.   Outcome: Ongoing (see interventions/notes)      Problem: Fall Risk (Adult)  Goal: Identify Related Risk Factors and Signs and Symptoms  Related risk factors and signs and symptoms are identified upon initiation of Human Response Clinical Practice Guideline (CPG).   Outcome: Completed Date Met: 01/27/17    Goal: Absence of Falls  Patient will demonstrate the desired outcomes by discharge/transition of care.   Outcome: Ongoing (see interventions/notes)      Problem: Brain Injury, Severe Traumatic (GCS 8 or less) (Adult)  Prevent and manage potential problems including:  1. acute neurologic deterioration  2. embolism  3. fluid/electrolyte imbalance  4. functional deficit/sensory impairment/immobility  5. gastrointestinal complications  6. hemodynamic instability  7. hypercatabolism  8. hyperglycemia  9. hypoxia/hypoxemia  10. infection  11. pain  12. situational response  13. skin breakdown   Goal: Signs and Symptoms of Listed Potential Problems Will be Absent, Minimized or Managed (Brain  Injury, Severe Traumatic)  Signs and symptoms of listed potential problems will be absent, minimized or managed by discharge/transition of care (reference Brain Injury, Severe Traumatic (GCS 8 or less) (Adult) CPG).   Outcome: Completed Date Met: 01/27/17      Problem: Nutrition, Enteral (Adult)  Prevent and manage potential problems including:  1. adverse events  2. aspiration  3. fluid/electrolyte imbalance  4. gastrointestinal complications  5. malnutrition  6. mechanical complications  7. skin/mucosal integrity impairment   Goal: Signs and Symptoms of Listed Potential Problems Will be Absent, Minimized or Managed (Nutrition, Enteral)  Signs and symptoms of listed potential problems will be absent, minimized or managed by discharge/transition of care (reference Nutrition, Enteral (Adult) CPG).   Outcome: Completed Date Met: 01/27/17
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
## 2017-01-28 ENCOUNTER — Inpatient Hospital Stay (HOSPITAL_COMMUNITY): Payer: BC Managed Care – PPO | Admitting: Radiology

## 2017-01-28 ENCOUNTER — Inpatient Hospital Stay (HOSPITAL_COMMUNITY): Payer: BC Managed Care – PPO

## 2017-01-28 DIAGNOSIS — S27321A Contusion of lung, unilateral, initial encounter: Secondary | ICD-10-CM

## 2017-01-28 DIAGNOSIS — S062X1A Diffuse traumatic brain injury with loss of consciousness of 30 minutes or less, initial encounter: Principal | ICD-10-CM

## 2017-01-28 DIAGNOSIS — R55 Syncope and collapse: Secondary | ICD-10-CM

## 2017-01-28 DIAGNOSIS — S2242XA Multiple fractures of ribs, left side, initial encounter for closed fracture: Secondary | ICD-10-CM

## 2017-01-28 DIAGNOSIS — I619 Nontraumatic intracerebral hemorrhage, unspecified: Secondary | ICD-10-CM

## 2017-01-28 DIAGNOSIS — I615 Nontraumatic intracerebral hemorrhage, intraventricular: Secondary | ICD-10-CM

## 2017-01-28 DIAGNOSIS — R918 Other nonspecific abnormal finding of lung field: Secondary | ICD-10-CM

## 2017-01-28 DIAGNOSIS — J690 Pneumonitis due to inhalation of food and vomit: Secondary | ICD-10-CM

## 2017-01-28 LAB — TRANSTHORACIC ECHOCARDIOGRAM - ADULT
AV mean gradient: 2
AV peak velocity post stress: 106
AVA VTI: 4
AVA Vmax: 3
AVE E/e': 0
Biplane Simpson EF: 59
EF MEASUREMENT VALUE: 52
Interventricular Septum Diastolic Thickness by 2D: 0.8 cm
LA Volume Index: 19
LVIDD - 2D: 4.5 cm
LVIDS 2D: 3.3 cm
LVPWD: 0.7 cm
Lateral MV Annulus e': 0
MV E/A: 2
Medial MV Annulus e': 0
RVSP: 0
TR VELOCITY: 280

## 2017-01-28 LAB — CBC
HCT: 35.1 % — ABNORMAL LOW (ref 36.7–47.0)
HGB: 12.4 g/dL — ABNORMAL LOW (ref 12.5–16.3)
MCH: 31.7 pg (ref 27.4–33.0)
MCHC: 35.3 g/dL (ref 32.5–35.8)
MCV: 89.8 fL (ref 78.0–100.0)
MPV: 7.2 fL — ABNORMAL LOW (ref 7.5–11.5)
PLATELETS: 340 x10ˆ3/uL (ref 140–450)
RBC: 3.9 x10?6/uL — ABNORMAL LOW (ref 4.06–5.63)
RDW: 12.3 % (ref 12.0–15.0)
WBC: 20.1 x10ˆ3/uL — ABNORMAL HIGH (ref 3.5–11.0)

## 2017-01-28 LAB — BASIC METABOLIC PANEL
ANION GAP: 8 mmol/L (ref 4–13)
BUN/CREA RATIO: 28 — ABNORMAL HIGH (ref 6–22)
BUN: 18 mg/dL (ref 8–25)
CALCIUM: 8.9 mg/dL (ref 8.5–10.2)
CHLORIDE: 110 mmol/L (ref 96–111)
CO2 TOTAL: 22 mmol/L (ref 22–32)
CREATININE: 0.65 mg/dL (ref 0.62–1.27)
ESTIMATED GFR: >59 mL/min/1.73mˆ2 (ref >59)
GLUCOSE: 166 mg/dL — ABNORMAL HIGH (ref 65–139)
POTASSIUM: 3.5 mmol/L (ref 3.5–5.1)
SODIUM: 140 mmol/L (ref 136–145)

## 2017-01-28 LAB — PHOSPHORUS: PHOSPHORUS: 2.8 mg/dL (ref 2.3–4.0)

## 2017-01-28 LAB — POC BLOOD GLUCOSE (RESULTS)
GLUCOSE, POC: 169 mg/dL — ABNORMAL HIGH (ref 70–105)
GLUCOSE, POC: 135 mg/dL — ABNORMAL HIGH (ref 70–105)
GLUCOSE, POC: 147 mg/dL — ABNORMAL HIGH (ref 70–105)
GLUCOSE, POC: 131 mg/dL — ABNORMAL HIGH (ref 70–105)

## 2017-01-28 LAB — MAGNESIUM: MAGNESIUM: 2.2 mg/dL (ref 1.6–2.5)

## 2017-01-28 MED ORDER — MAGNESIUM HYDROXIDE 400 MG/5 ML ORAL SUSPENSION
15.0000 mL | Freq: Every day | ORAL | Status: DC
Start: 2017-01-28 — End: 2017-01-30
  Administered 2017-01-28: 1200 mg via GASTROSTOMY
  Administered 2017-01-29 – 2017-01-30 (×2): 0 mg via GASTROSTOMY
  Filled 2017-01-28: qty 30

## 2017-01-28 MED ORDER — AMPICILLIN-SULBACTAM 15 GRAM SOLUTION FOR INJECTION
3.0000 g | Freq: Four times a day (QID) | INTRAMUSCULAR | Status: DC
Start: 2017-01-28 — End: 2017-01-30
  Administered 2017-01-28: 0 g via INTRAVENOUS
  Administered 2017-01-28: 3 g via INTRAVENOUS
  Administered 2017-01-28: 0 g via INTRAVENOUS
  Administered 2017-01-28: 3 g via INTRAVENOUS
  Administered 2017-01-29 (×2): 0 g via INTRAVENOUS
  Administered 2017-01-29: 3 g via INTRAVENOUS
  Administered 2017-01-29: 0 g via INTRAVENOUS
  Administered 2017-01-29 (×2): 3 g via INTRAVENOUS
  Administered 2017-01-29: 0 g via INTRAVENOUS
  Administered 2017-01-29 – 2017-01-30 (×3): 3 g via INTRAVENOUS
  Administered 2017-01-30 (×2): 0 g via INTRAVENOUS
  Filled 2017-01-28 (×9): qty 20

## 2017-01-28 MED ORDER — BISACODYL 10 MG RECTAL SUPPOSITORY
10.0000 mg | Freq: Once | RECTAL | Status: AC
Start: 2017-01-28 — End: 2017-01-28
  Administered 2017-01-28: 10 mg via RECTAL
  Filled 2017-01-28: qty 1

## 2017-01-28 MED ORDER — PERFLUTREN LIPID MICROSPHERES 1.1 MG/ML INTRAVENOUS SUSPENSION
0.30 mL | INTRAVENOUS | Status: DC
Start: 2017-01-29 — End: 2017-01-29

## 2017-01-28 MED ORDER — ACETAMINOPHEN 325 MG TABLET
975.00 mg | ORAL_TABLET | Freq: Four times a day (QID) | ORAL | Status: DC
Start: 2017-01-28 — End: 2017-01-31
  Administered 2017-01-28 – 2017-01-29 (×2): 975 mg via GASTROSTOMY
  Administered 2017-01-29: 0 mg via GASTROSTOMY
  Administered 2017-01-29 – 2017-01-30 (×7): 975 mg via GASTROSTOMY
  Administered 2017-01-31: 0 mg via GASTROSTOMY
  Administered 2017-01-31: 975 mg via GASTROSTOMY
  Filled 2017-01-28 (×10): qty 3

## 2017-01-28 MED ADMIN — sodium chloride 0.9 % (flush) injection syringe: GASTROSTOMY | @ 08:00:00

## 2017-01-28 MED ADMIN — insulin regular human 100 unit/mL injection solution: SUBCUTANEOUS | @ 04:00:00

## 2017-01-28 MED ADMIN — senna leaf extract 176 mg/5 mL oral syrup: GASTROSTOMY | @ 08:00:00
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
## 2017-01-28 NOTE — Care Plan (Signed)
Problem: Patient Care Overview (Adult,OB)  Goal: Plan of Care Review(Adult,OB)  The patient and/or their representative will communicate an understanding of their plan of care   Outcome: Ongoing (see interventions/notes)   OXYGEN - HIGH FLOW BLENDED NC (ADULTS) CONTINUOUS Discontinue   Duration: Until Specified    Priority: Routine       References: [place] - HIGH FLOW ALGORITHM   Question Answer Comment   Flowrate (L/min) 60    Blended FIO2 40    Indications for O2 OXYGEN AFTER EXTUBATION for PEEP            Patient remained on above HFNC settings throughout the night without issue. Patient was unable to follow directions for PEP and FVC/NIF all night. Will continue to monitor respiratory status.

## 2017-01-28 NOTE — Ancillary Notes (Signed)
SBIRT    SBIRT not completed at this time due to patient's medical status -Unable to assess orientation, unable to follow commands.  Pending recommendations:  Attempt to complete SBIRT at later time/date.    Opal Sidles, STUDENT PSYCHOLOGY Clinical Therapist 01/28/2017, 08:02  Pager  [phone]    Riley Nearing. Verdell Carmine , Clinical Therapist 01/28/2017, 08:31  Pager # [phone]

## 2017-01-28 NOTE — Respiratory Therapy (Signed)
01/28/17 1128       Respiratory Parameters   Start Time 1128   Respiratory Effort Unable to Perform  (Pt unable to follow commands for proper usage)   $Parameters (Resp only) Not Done  (SICU service aware)   Stop Time 1129   Duration 1 Minutes

## 2017-01-28 NOTE — Care Plan (Signed)
Problem: Patient Care Overview (Adult,OB)  Goal: Plan of Care Review(Adult,OB)  The patient and/or their representative will communicate an understanding of their plan of care   Outcome: Ongoing (see interventions/notes)  Pt's neuro status continuing to wax and wane. Pt able to stick out his tongue this morning, but unable to follow any commands this evening. Dr. Julien Girt of SICU notified. Percussion and vibration therapy performed. Pt febrile this morning. PRN tylenol and ice packs administered. Unasyn started and scheduled tylenol ordered per service. Pt's right IJ and right arterial line removed per service. Pt had two BMs this evening. Pt OOB for an hour in the late morning and OOB this evening with maximove. Pt turned and repositioned. Preventative mepelexes in place on pt's elbows, heels, and sacrum. Pt's sister, mother, and wife at bedside throughout day. Family attentive to pt. Trauma and SICU to bedside to assess pt today and answering family members' questions.   Goal: Individualization/Patient Specific Goal(Adult/OB)  Outcome: Ongoing (see interventions/notes)    Goal: Interdisciplinary Rounds/Family Conf  Outcome: Ongoing (see interventions/notes)      Problem: Skin Injury Risk (Adult,Obstetrics,Pediatric)  Goal: Skin Health and Integrity  Patient will demonstrate the desired outcomes by discharge/transition of care.   Outcome: Ongoing (see interventions/notes)      Problem: Fall Risk (Adult)  Goal: Absence of Falls  Patient will demonstrate the desired outcomes by discharge/transition of care.   Outcome: Ongoing (see interventions/notes)      Problem: Brain Injury, Severe Traumatic (GCS 8 or less) (Adult)  Prevent and manage potential problems including:  1. acute neurologic deterioration  2. embolism  3. fluid/electrolyte imbalance  4. functional deficit/sensory impairment/immobility  5. gastrointestinal complications  6. hemodynamic instability  7. hypercatabolism  8. hyperglycemia  9. hypoxia/hypoxemia   10. infection  11. pain  12. situational response  13. skin breakdown   Goal: Signs and Symptoms of Listed Potential Problems Will be Absent, Minimized or Managed (Brain Injury, Severe Traumatic)  Signs and symptoms of listed potential problems will be absent, minimized or managed by discharge/transition of care (reference Brain Injury, Severe Traumatic (GCS 8 or less) (Adult) CPG).   Outcome: Ongoing (see interventions/notes)      Problem: Nutrition, Enteral (Adult)  Prevent and manage potential problems including:  1. adverse events  2. aspiration  3. fluid/electrolyte imbalance  4. gastrointestinal complications  5. malnutrition  6. mechanical complications  7. skin/mucosal integrity impairment   Goal: Signs and Symptoms of Listed Potential Problems Will be Absent, Minimized or Managed (Nutrition, Enteral)  Signs and symptoms of listed potential problems will be absent, minimized or managed by discharge/transition of care (reference Nutrition, Enteral (Adult) CPG).   Outcome: Ongoing (see interventions/notes)

## 2017-01-28 NOTE — Nurses Notes (Signed)
01/28/17 0811   Vital Signs   Temperature 38.4 C (101.1 F)     Pt's temperature increased. Ice packs applied to pt, cool cloth applied, room temperature decreased, and PRN Tylenol administered. Dr. Roderic Ovens of SICU notified.
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
## 2017-01-28 NOTE — Respiratory Therapy (Signed)
Patient does not follow commands, unable to assess respiratory status.  HFNC 40%/60L for positive. Continue nebs, will continue to monitor.

## 2017-01-28 NOTE — Respiratory Therapy (Signed)
01/28/17 8177       Respiratory Parameters   Start Time 0822   Respiratory Effort Unable to Perform  (patient unable to follow commands)   $Parameters (Resp only) Not Done  (SICU resident Primitivo Gauze notified)

## 2017-01-29 ENCOUNTER — Inpatient Hospital Stay (HOSPITAL_COMMUNITY): Payer: BC Managed Care – PPO

## 2017-01-29 DIAGNOSIS — Z452 Encounter for adjustment and management of vascular access device: Secondary | ICD-10-CM

## 2017-01-29 DIAGNOSIS — J9811 Atelectasis: Secondary | ICD-10-CM

## 2017-01-29 DIAGNOSIS — J90 Pleural effusion, not elsewhere classified: Secondary | ICD-10-CM

## 2017-01-29 DIAGNOSIS — J690 Pneumonitis due to inhalation of food and vomit: Secondary | ICD-10-CM | POA: Diagnosis not present

## 2017-01-29 LAB — BASIC METABOLIC PANEL
ANION GAP: 10 mmol/L (ref 4–13)
ANION GAP: 10 mmol/L (ref 4–13)
BUN/CREA RATIO: 37 — ABNORMAL HIGH (ref 6–22)
BUN: 25 mg/dL (ref 8–25)
CALCIUM: 9.1 mg/dL (ref 8.5–10.2)
CHLORIDE: 110 mmol/L (ref 96–111)
CO2 TOTAL: 24 mmol/L (ref 22–32)
CREATININE: 0.68 mg/dL (ref 0.62–1.27)
ESTIMATED GFR: >59 mL/min/1.73m?2 (ref >59)
GLUCOSE: 157 mg/dL — ABNORMAL HIGH (ref 65–139)
POTASSIUM: 3.9 mmol/L (ref 3.5–5.1)
SODIUM: 144 mmol/L (ref 136–145)

## 2017-01-29 LAB — CBC
HCT: 34.3 % — ABNORMAL LOW (ref 36.7–47.0)
HGB: 11.9 g/dL — ABNORMAL LOW (ref 12.5–16.3)
MCH: 31.2 pg (ref 27.4–33.0)
MCHC: 34.7 g/dL (ref 32.5–35.8)
MCV: 90.1 fL (ref 78.0–100.0)
MPV: 7.5 fL (ref 7.5–11.5)
PLATELETS: 356 x10ˆ3/uL (ref 140–450)
RBC: 3.81 x10ˆ6/uL — ABNORMAL LOW (ref 4.06–5.63)
RDW: 12.5 % (ref 12.0–15.0)
WBC: 19.6 x10ˆ3/uL — ABNORMAL HIGH (ref 3.5–11.0)

## 2017-01-29 LAB — MAGNESIUM: MAGNESIUM: 2.4 mg/dL (ref 1.6–2.5)

## 2017-01-29 LAB — POC BLOOD GLUCOSE (RESULTS)
GLUCOSE, POC: 115 mg/dL — ABNORMAL HIGH (ref 70–105)
GLUCOSE, POC: 127 mg/dL — ABNORMAL HIGH (ref 70–105)
GLUCOSE, POC: 143 mg/dL — ABNORMAL HIGH (ref 70–105)
GLUCOSE, POC: 154 mg/dL — ABNORMAL HIGH (ref 70–105)

## 2017-01-29 LAB — PHOSPHORUS: PHOSPHORUS: 3.7 mg/dL (ref 2.3–4.0)

## 2017-01-29 MED ORDER — OXYCODONE 20 MG/ML ORAL CONCENTRATE
5.00 mg | Freq: Four times a day (QID) | ORAL | Status: DC | PRN
Start: 2017-01-29 — End: 2017-01-31
  Administered 2017-01-29: 0 mg via GASTROSTOMY
  Administered 2017-01-29 (×2): 5 mg via GASTROSTOMY
  Filled 2017-01-29 (×3): qty 0.5

## 2017-01-29 MED ORDER — ALBUMIN, HUMAN 5 % INTRAVENOUS SOLUTION
12.5000 g | Freq: Once | INTRAVENOUS | Status: AC
Start: 2017-01-29 — End: 2017-01-29
  Administered 2017-01-29: 12.5 g via INTRAVENOUS
  Administered 2017-01-29: 0 g via INTRAVENOUS

## 2017-01-29 MED ADMIN — Medication: INTRAVENOUS | @ 08:00:00

## 2017-01-29 MED ADMIN — sodium chloride 0.9 % (flush) injection syringe: @ 22:00:00

## 2017-01-29 MED ADMIN — docusate sodium 50 mg/5 mL oral liquid: GASTROSTOMY | @ 08:00:00

## 2017-01-29 MED ADMIN — ADULT CUSTOM PARENTERAL NUTRITION: RESPIRATORY_TRACT | @ 03:00:00 | NDC 00338064406

## 2017-01-29 MED ADMIN — lactated Ringers intravenous solution: GASTROSTOMY | @ 19:00:00 | NDC 00264775000

## 2017-01-29 MED ADMIN — PANTOPRAZOLE 80MG IN NS 100ML CONTINUOUS INFUSION: GASTROSTOMY | @ 08:00:00 | NDC 00008092355

## 2017-01-29 NOTE — Care Plan (Signed)
Problem: Patient Care Overview (Adult,OB)  Goal: Plan of Care Review(Adult,OB)  The patient and/or their representative will communicate an understanding of their plan of care   Outcome: Ongoing (see interventions/notes)  Patient has remained on a HFNC throughout the day without issue. HFNC settings have remained 35% FiO2 and 60L of flow for lung expansion (per SICU team request). Patient's mental status continues to improve. He was able to follow commands for his FVC test today which was 1.4L, but was unable to follow for a proper NIF to be obtained. Patient was switched to stepdown status this afternoon. Will continue to monitor and continue currently ordered regimen.

## 2017-01-29 NOTE — Care Plan (Signed)
Problem: Patient Care Overview (Adult,OB)  Goal: Plan of Care Review(Adult,OB)  The patient and/or their representative will communicate an understanding of their plan of care   Outcome: Ongoing (see interventions/notes)  Currently on HFNC 60L/40%. Will continue to follow and monitor patient's respiratory status weaning as tolerated.
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
## 2017-01-30 ENCOUNTER — Inpatient Hospital Stay (HOSPITAL_COMMUNITY): Payer: BC Managed Care – PPO

## 2017-01-30 DIAGNOSIS — J90 Pleural effusion, not elsewhere classified: Secondary | ICD-10-CM

## 2017-01-30 LAB — CBC WITH DIFF
BASOPHIL #: 0.17 x10ˆ3/uL (ref 0.00–0.20)
BASOPHIL %: 1 %
EOSINOPHIL #: 0.26 x10?3/uL (ref 0.00–0.50)
EOSINOPHIL %: 2 %
HCT: 35.9 % — ABNORMAL LOW (ref 36.7–47.0)
HGB: 12.2 g/dL — ABNORMAL LOW (ref 12.5–16.3)
LYMPHOCYTE #: 1.48 x10ˆ3/uL (ref 1.00–4.80)
LYMPHOCYTE %: 9 %
MCH: 30.8 pg (ref 27.4–33.0)
MCHC: 34.2 g/dL (ref 32.5–35.8)
MCV: 90.2 fL (ref 78.0–100.0)
MONOCYTE #: 1.91 x10ˆ3/uL — ABNORMAL HIGH (ref 0.30–1.00)
MONOCYTE %: 11 %
MPV: 7.9 fL (ref 7.5–11.5)
MPV: 7.9 fL (ref 7.5–11.5)
NEUTROPHIL #: 13.36 x10ˆ3/uL — ABNORMAL HIGH (ref 1.50–7.70)
NEUTROPHIL %: 78 %
PLATELETS: 403 x10?3/uL (ref 140–450)
RBC: 3.97 x10?6/uL — ABNORMAL LOW (ref 4.06–5.63)
RDW: 12.7 % (ref 12.0–15.0)
WBC: 17.2 x10ˆ3/uL — ABNORMAL HIGH (ref 3.5–11.0)

## 2017-01-30 LAB — BASIC METABOLIC PANEL
ANION GAP: 10 mmol/L (ref 4–13)
BUN/CREA RATIO: 34 — ABNORMAL HIGH (ref 6–22)
BUN: 21 mg/dL (ref 8–25)
CALCIUM: 9.4 mg/dL (ref 8.5–10.2)
CHLORIDE: 112 mmol/L — ABNORMAL HIGH (ref 96–111)
CO2 TOTAL: 23 mmol/L (ref 22–32)
CREATININE: 0.62 mg/dL (ref 0.62–1.27)
ESTIMATED GFR: >59 mL/min/1.73mˆ2 (ref >59)
GLUCOSE: 139 mg/dL (ref 65–139)
POTASSIUM: 3.6 mmol/L (ref 3.5–5.1)
SODIUM: 145 mmol/L (ref 136–145)
SODIUM: 145 mmol/L (ref 136–145)

## 2017-01-30 LAB — POC BLOOD GLUCOSE (RESULTS)
GLUCOSE, POC: 132 mg/dL — ABNORMAL HIGH (ref 70–105)
GLUCOSE, POC: 139 mg/dL — ABNORMAL HIGH (ref 70–105)
GLUCOSE, POC: 145 mg/dL — ABNORMAL HIGH (ref 70–105)
GLUCOSE, POC: 190 mg/dL — ABNORMAL HIGH (ref 70–105)

## 2017-01-30 MED ORDER — POTASSIUM CHLORIDE 20 MEQ/15 ML ORAL LIQUID
40.0000 meq | Freq: Once | ORAL | Status: AC
Start: 2017-01-30 — End: 2017-01-30
  Administered 2017-01-30: 40 meq via GASTROSTOMY
  Filled 2017-01-30: qty 30

## 2017-01-30 MED ORDER — HYDRALAZINE 20 MG/ML INJECTION SOLUTION
10.0000 mg | Freq: Four times a day (QID) | INTRAMUSCULAR | Status: DC | PRN
Start: 2017-01-30 — End: 2017-02-03
  Administered 2017-01-30 (×2): 10 mg via INTRAVENOUS
  Filled 2017-01-30: qty 1

## 2017-01-30 MED ORDER — AMOXICILLIN 200 MG-POTASSIUM CLAVULANATE 28.5 MG/5 ML ORAL SUSPENSION
875.00 mg | INHALATION_SUSPENSION | Freq: Two times a day (BID) | ORAL | Status: DC
Start: 2017-01-30 — End: 2017-01-31
  Administered 2017-01-30 – 2017-01-31 (×2): 875 mg via GASTROSTOMY
  Filled 2017-01-30 (×4): qty 21.9

## 2017-01-30 MED ORDER — CLONIDINE HCL 0.1 MG TABLET
0.20 mg | ORAL_TABLET | Freq: Two times a day (BID) | ORAL | Status: DC
Start: 2017-01-30 — End: 2017-01-31
  Administered 2017-01-30 – 2017-01-31 (×3): 0.2 mg via GASTROSTOMY
  Filled 2017-01-30 (×4): qty 2

## 2017-01-30 MED ADMIN — PRISMASATE (BICARBONATE-BASED) 4K DIALYSATE: GASTROSTOMY | @ 09:00:00 | NDC 09991000633

## 2017-01-30 MED ADMIN — acetaminophen 325 mg tablet: GASTROSTOMY

## 2017-01-30 MED ADMIN — sodium chloride 0.9 % intravenous solution: GASTROSTOMY | @ 09:00:00

## 2017-01-30 MED ADMIN — amLODIPine 5 mg tablet: RESPIRATORY_TRACT | @ 20:00:00

## 2017-01-30 MED ADMIN — lidocaine HCL 10 mg/mL (1 %) injection solution: GASTROSTOMY | @ 08:00:00

## 2017-01-30 NOTE — Care Management Notes (Signed)
MSW met with family at bedside, son will not be in town until later this evening and requesting MSW check back tomorrow for rehab placement choice.
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
## 2017-01-30 NOTE — Care Plan (Signed)
Problem: Patient Care Overview (Adult,OB)  Goal: Plan of Care Review(Adult,OB)  The patient and/or their representative will communicate an understanding of their plan of care   Outcome: Ongoing (see interventions/notes)  Pt remains on HFNC 35% and Flow of 60. No changes at this time to HFNC per SICU. Continue neb treatments as ordered. Pt has daily parameters and has only been able to do FVC at this time.

## 2017-01-30 NOTE — Care Plan (Signed)
[place]  Occupational Therapy Progress Note    Patient Name: Terry Reid  Date of Birth: Mar 18, 1956  Height:  190.5 cm (6\' 3" )  Weight:  95 kg (209 lb 7 oz)  Room/Bed: 777/A  Payor: [employer] / Plan: [employer] / Product Type: Auto /     Assessment:    Pt tolerated OT session fair this date. Pt with improved cognition this session able to briefly converse and follow some commands during ROM. Pt's family educated on importance of controlling outside stimuli during pt's healing which pt's sister reported understanding. OT continues to recommend acute rehab for TBI intervention at this time.       Discharge Needs:   Equipment Recommendation: to be determined   Discharge Disposition:  inpatient rehabilitation facility     JUSTIFICATION OF DISCHARGE RECOMMENDATION   Based on current diagnosis, functional performance prior to admission, and current functional performance, this patient requires continued OT services in inpatient rehabilitation facility in order to achieve significant functional improvements.    Plan:   Continue to follow patient according to established plan of care.  The risks/benefits of therapy have been discussed with the patient/caregiver and he/she is in agreement with the established plan of care.     Subjective & Objective:        01/30/17 1127   Therapist Pager   OT Assigned/ Pager # Ysidro Evert [phone]   Rehab Session   Document Type therapy note (daily note)   Total OT Minutes: 26   Patient Effort good   Symptoms Noted During/After Treatment none   General Information   Patient Profile Reviewed? yes   Medical Lines PIV Line;Telemetry;NG Tube   Respiratory Status nasal cannula   Existing Precautions/Restrictions fall precautions;full code;weight bearing restriction;oxygen therapy device and L/min   Pre Treatment Status   Pre Treatment Patient Status Patient supine in bed;Venodynes in place and activated;Call light within reach   Support Present Pre  Treatment  Family present  (Sister and Mother)   Mutuality/Individual Preferences   Patient Specific Interventions OOB with Maxi Move   Mutuality Comment RN and pt's family agreeable to pt being seen by OT and PT this date.    Vital Signs   O2 Delivery Pre Treatment supplemental O2   O2 Delivery Post Treatment supplemental O2   Pain Assessment   Pre/Post Treatment Pain Comment No signs of discomfort noted during rest or ROM tasks.    Coping/Psychosocial   Observed Emotional State calm;cooperative   Verbalized Emotional State other (see comments)  (UTA)   Family/Support System   Family/Support System, Persons mother;sibling   Involvement in Care at bedside;attentive to patient;interacting with patient;participating in care   Coping/Psychosocial Response Interventions   Plan Of Care Reviewed With patient;mother;sibling   Cognitive Assessment/Interventions   Behavior/Mood Observations lethargic;cooperative   Orientation Status  oriented to;person   Attention severe impairment;difficulty attending to task/directions;needs cues to re-direct   Follows Commands  other (see comments);delayed response/completion;increased processing time needed;initiation impaired;physical/tactile prompts required;repetition of directions required;verbal cues/prompting required  (50% accuracy with command following )   Comment Pt lethargic during session however with prompting pt able to remain alert and minimally converse with therapists. Pt able to report his name and read clock on wall (misread hour and minute hands). Pt demonstrated ability to visually track from reclined position however when DEP sat upright in bed pt unable to follow commands for visual tracking.    Mobility Assessment/Training   Extremity Weight-bearing Status  left upper extremity;left lower extremity   Left Upper Extremity weight-bearing as tolerated (WBAT)   Left Lower Extremity weight-bearing as tolerated (WBAT)   Balance Skill Training   Comment Pt DEP brought  into upright sitting position requiring DEP assistance to maintain. Pt demonstrated increased eye opening during position however pt not visually tracking from this position.   Sitting Balance: Static unable to balance   Therapeutic Exercise/Activity   Comment PROM completed to BUE in all planes as tolerated. Pt demonstrated ability to complete AAROM in L digits and elbow this date. Pt's pain reception assessed this date. Pt educated to state "stop" if pt felt pain in extremities during testing. Pt able to identify sensation in bilateral shoulders, L elbow, and LEs (see PT note for specifics).    Post Treatment Status   Post Treatment Patient Status Patient supine in bed;Call light within reach;Venodynes in place and activated   Support Present Post Treatment  Family present   Occupational Therapy Clinical Impression   Functional Level at Time of Session Pt tolerated OT session fair this date. Pt with improved cognition this session able to briefly converse and follow some commands during ROM. Pt's family educated on importance of controlling outside stimuli during pt's healing which pt's sister reported understanding. OT continues to recommend acute rehab for TBI intervention at this time.    Anticipated Equipment Needs at Discharge to be determined   Anticipated Discharge Disposition inpatient rehabilitation facility       Therapist:   Natasha Mead, OT  Pager #: [phone]
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
## 2017-01-30 NOTE — Care Plan (Addendum)
[name]  Physical Therapy Progress Note      Patient Name: Terry Reid  Date of Birth: 1956/02/01  Height:  190.5 cm (6\' 3" )  Weight:  95 kg (209 lb 7 oz)  Room/Bed: 777/A  Payor: [employer] / Plan: [employer] / Product Type: Auto /     Assessment:     Good tolerance to PT treatment this date. Pt intermittenly awake and intermittenly followed commands. No active movement in RLE, active movement in LLE noted. Improved cognition this date. Will follow.     Discharge Needs:   Equipment Recommendation: TBD    Discharge Disposition: inpatient rehabilitation facility    JUSTIFICATION OF DISCHARGE RECOMMENDATION   Based on current diagnosis, functional performance prior to admission, and current functional performance, this patient requires continued PT services in inpatient rehabilitation facility in order to achieve significant functional improvements in these deficit areas: arousal, attention, and cognition, aerobic capacity/endurance, gait, locomotion, and balance, ROM (range of motion), muscle performance.      Plan:   Continue to follow patient according to established plan of care.  The risks/benefits of therapy have been discussed with the patient/caregiver and he/she is in agreement with the established plan of care.     Subjective & Objective:        01/30/17 1126   Therapist Pager   PT Assigned/ Pager # ev [phone]   Rehab Session   Document Type therapy note (daily note)   Total PT Minutes: 26   Patient Effort good   Symptoms Noted During/After Treatment none   General Information   Patient Profile Reviewed? yes   Patient/Family Observations Pt seen bedside this date, supine in bed, RN approved session, seen with professional assist of OT   Medical Lines PIV Line;Telemetry   Respiratory Status nasal cannula   Existing Precautions/Restrictions fall precautions;full code;weight bearing restriction   Weight-bearing Status   Extremity Weight-bearing Status left upper  extremity;left lower extremity   Left Upper Extremity weight-bearing as tolerated (WBAT)   Left Lower Extremity weight-bearing as tolerated (WBAT)   Mutuality/Individual Preferences   Patient Specific Interventions OOB with maxi move   Pre Treatment Status   Pre Treatment Patient Status Patient supine in bed;Call light within reach;Venodynes in place and activated   Support Present Pre Treatment  Family present   Cognitive Assessment/Interventions   Comment Pt followed commands pretty consistenly; able to communicate when questions asked   Pain Assessment   Pre/Post Treatment Pain Comment no signs or symptoms of pain noted throughout session   Bed Mobility Assessment/Treatment   Comment  held at this time as pt was transferring to a stepdown floor   Therapeutic Exercise/Activity   Comment PROM completed to BLEs with gentle end range stretching; some active movement in LLE and pt was intermittenly able to follow commands to perform exercises on LLE; no active movement in RLE; RLE (+) clonus   Post Treatment Status   Post Treatment Patient Status Patient supine in bed;Call light within reach;Venodynes in place and activated   Support Present Post Treatment  Family present   Plan of Care Review   Plan Of Care Reviewed With patient;family   Physical Therapy Clinical Impression   Assessment Good tolerance to PT treatment this date. Pt intermittenly awake and intermittenly followed commands. No active movement in RLE, active movement in LLE noted. Improved cognition this date. Will follow.    Anticipated Equipment Needs at Discharge (PT Clinical Impression) TBD  Anticipated Discharge Disposition  inpatient rehabilitation facility       Therapist:   Blanche East, [place]   Pager #: [phone]

## 2017-01-31 DIAGNOSIS — G908 Other disorders of autonomic nervous system: Secondary | ICD-10-CM | POA: Diagnosis not present

## 2017-01-31 DIAGNOSIS — J690 Pneumonitis due to inhalation of food and vomit: Secondary | ICD-10-CM

## 2017-01-31 DIAGNOSIS — S6991XA Unspecified injury of right wrist, hand and finger(s), initial encounter: Secondary | ICD-10-CM

## 2017-01-31 DIAGNOSIS — G909 Disorder of the autonomic nervous system, unspecified: Secondary | ICD-10-CM | POA: Diagnosis not present

## 2017-01-31 LAB — BASIC METABOLIC PANEL
ANION GAP: 10 mmol/L (ref 4–13)
BUN/CREA RATIO: 34 — ABNORMAL HIGH (ref 6–22)
BUN: 21 mg/dL (ref 8–25)
CALCIUM: 9.6 mg/dL (ref 8.5–10.2)
CHLORIDE: 109 mmol/L (ref 96–111)
CO2 TOTAL: 21 mmol/L — ABNORMAL LOW (ref 22–32)
CREATININE: 0.61 mg/dL — ABNORMAL LOW (ref 0.62–1.27)
ESTIMATED GFR: >59 mL/min/1.73mˆ2 (ref >59)
GLUCOSE: 109 mg/dL (ref 65–139)
POTASSIUM: 3.6 mmol/L (ref 3.5–5.1)
SODIUM: 140 mmol/L (ref 136–145)

## 2017-01-31 LAB — MANUAL DIFFERENTIAL (CELLAVISION)
BANDS: 1 % (ref 0–15)
BASOPHIL #: 0.16 x10?3/uL (ref 0.00–0.20)
BASOPHIL %: 1 %
EOSINOPHIL #: 0.61 x10?3/uL — ABNORMAL HIGH (ref 0.00–0.50)
EOSINOPHIL %: 3 %
LYMPHOCYTE #: 2.88 x10?3/uL (ref 1.00–4.80)
LYMPHOCYTE %: 16 %
METAMYELOCYTES: 1 % (ref 0–2)
MONOCYTE #: 0.75 x10?3/uL (ref 0.30–1.00)
MONOCYTE %: 4 %
MYELOCYTES: 1 % — ABNORMAL HIGH (ref 0–0)
NEUTROPHIL #: 13.21 x10?3/uL — ABNORMAL HIGH (ref 1.50–7.70)
NEUTROPHIL %: 73 %

## 2017-01-31 LAB — CBC WITH DIFF
HCT: 38.5 % (ref 36.7–47.0)
HGB: 13.4 g/dL (ref 12.5–16.3)
MCH: 31.2 pg (ref 27.4–33.0)
MCHC: 34.8 g/dL (ref 32.5–35.8)
MCV: 89.6 fL (ref 78.0–100.0)
MPV: 8 fL (ref 7.5–11.5)
PLATELETS: 433 x10ˆ3/uL (ref 140–450)
RBC: 4.3 x10ˆ6/uL (ref 4.06–5.63)
RDW: 12.7 % (ref 12.0–15.0)
WBC: 17.9 10*3/uL — ABNORMAL HIGH (ref 3.5–11.0)
WBC: 17.9 x10ˆ3/uL — ABNORMAL HIGH (ref 3.5–11.0)

## 2017-01-31 LAB — POC BLOOD GLUCOSE (RESULTS)
GLUCOSE, POC: 106 mg/dL — ABNORMAL HIGH (ref 70–105)
GLUCOSE, POC: 130 mg/dL — ABNORMAL HIGH (ref 70–105)
GLUCOSE, POC: 154 mg/dL — ABNORMAL HIGH (ref 70–105)

## 2017-01-31 MED ORDER — PROPRANOLOL 40 MG TABLET
40.00 mg | ORAL_TABLET | Freq: Three times a day (TID) | ORAL | Status: DC
Start: 2017-01-31 — End: 2017-02-12
  Administered 2017-01-31 – 2017-02-12 (×36): 40 mg via ORAL
  Filled 2017-01-31 (×38): qty 1

## 2017-01-31 MED ORDER — CLONIDINE HCL 0.1 MG TABLET
0.20 mg | ORAL_TABLET | Freq: Two times a day (BID) | ORAL | Status: DC
Start: 2017-01-31 — End: 2017-02-03
  Administered 2017-01-31 – 2017-02-03 (×7): 0.2 mg via ORAL
  Filled 2017-01-31 (×7): qty 2

## 2017-01-31 MED ORDER — SENNA LEAF EXTRACT 176 MG/5 ML ORAL SYRUP
5.00 mL | ORAL_SOLUTION | Freq: Two times a day (BID) | ORAL | Status: DC
Start: 2017-01-31 — End: 2017-02-01
  Administered 2017-01-31: 0 mg via ORAL
  Filled 2017-01-31 (×3): qty 15

## 2017-01-31 MED ORDER — NUTRITION PROTEIN SUPPLEMENT - TUBE FEED
2.0000 | Freq: Two times a day (BID) | Status: DC
Start: 2017-01-31 — End: 2017-02-12
  Administered 2017-01-31 – 2017-02-04 (×8): 30 g via ORAL
  Administered 2017-02-05: 0 g via ORAL
  Administered 2017-02-05 – 2017-02-12 (×14): 30 g via ORAL

## 2017-01-31 MED ORDER — DOCUSATE SODIUM 50 MG/5 ML ORAL LIQUID
100.0000 mg | Freq: Every day | ORAL | Status: DC
Start: 2017-02-01 — End: 2017-02-01
  Filled 2017-01-31: qty 10

## 2017-01-31 MED ORDER — POTASSIUM CHLORIDE 20 MEQ/15 ML ORAL LIQUID
40.0000 meq | Freq: Once | ORAL | Status: AC
Start: 2017-01-31 — End: 2017-01-31
  Administered 2017-01-31 (×2): 40 meq via GASTROSTOMY
  Filled 2017-01-31: qty 30

## 2017-01-31 MED ORDER — ALBUTEROL SULFATE 2.5 MG/3 ML (0.083 %) SOLUTION FOR NEBULIZATION
2.50 mg | INHALATION_SOLUTION | Freq: Four times a day (QID) | RESPIRATORY_TRACT | Status: DC
Start: 2017-01-31 — End: 2017-02-02
  Administered 2017-01-31 – 2017-02-02 (×12): 2.5 mg via RESPIRATORY_TRACT
  Filled 2017-01-31: qty 18
  Filled 2017-01-31: qty 12
  Filled 2017-01-31: qty 9

## 2017-01-31 MED ORDER — FAMOTIDINE 20 MG TABLET
20.00 mg | ORAL_TABLET | Freq: Two times a day (BID) | ORAL | Status: DC
Start: 2017-01-31 — End: 2017-02-12
  Administered 2017-01-31 – 2017-02-12 (×24): 20 mg via ORAL
  Filled 2017-01-31 (×24): qty 1

## 2017-01-31 MED ORDER — OXYCODONE 20 MG/ML ORAL CONCENTRATE
5.00 mg | Freq: Four times a day (QID) | ORAL | Status: DC | PRN
Start: 2017-01-31 — End: 2017-02-03

## 2017-01-31 MED ORDER — ACETAMINOPHEN 325 MG TABLET
975.00 mg | ORAL_TABLET | Freq: Four times a day (QID) | ORAL | Status: DC
Start: 2017-01-31 — End: 2017-02-03
  Administered 2017-01-31 – 2017-02-01 (×6): 975 mg via ORAL
  Administered 2017-02-02: 0 mg via ORAL
  Administered 2017-02-02 – 2017-02-03 (×5): 975 mg via ORAL
  Filled 2017-01-31 (×10): qty 3
  Filled 2017-01-31: qty 2
  Filled 2017-01-31: qty 3

## 2017-01-31 MED ORDER — DESIPRAMINE 50 MG TABLET
50.00 mg | ORAL_TABLET | Freq: Every evening | ORAL | Status: DC
Start: 2017-01-31 — End: 2017-02-12
  Administered 2017-01-31 – 2017-02-11 (×12): 50 mg via ORAL
  Filled 2017-01-31 (×13): qty 1

## 2017-01-31 MED ORDER — AMOXICILLIN 200 MG-POTASSIUM CLAVULANATE 28.5 MG/5 ML ORAL SUSPENSION
875.00 mg | INHALATION_SUSPENSION | Freq: Two times a day (BID) | ORAL | Status: AC
Start: 2017-01-31 — End: 2017-02-03
  Administered 2017-01-31 – 2017-02-03 (×8): 875 mg via ORAL
  Filled 2017-01-31 (×8): qty 21.9

## 2017-01-31 MED ADMIN — sodium chloride 0.9 % (flush) injection syringe: @ 13:00:00

## 2017-01-31 MED ADMIN — sodium chloride 0.9 % (flush) injection syringe: ORAL | @ 21:00:00

## 2017-01-31 MED ADMIN — sodium chloride 0.9 % intravenous solution: GASTROSTOMY | @ 06:00:00 | NDC 00338004904

## 2017-01-31 NOTE — Nurses Notes (Signed)
Upon entering patient's room, NG found pulled out and tele leads removed. Trauma currently rounding and stated ok to leave NG out at this time. Will encourage PO intake and assist with feeding. Will place patient on video monitoring and continue to monitor.

## 2017-01-31 NOTE — Nurses Notes (Signed)
Protective heel mepilex dressing applied to bilateral heels for skin breakdown prevention. Patients heels elevated off the bed. No signs of skin breakdown present at this time. Will monitor.

## 2017-01-31 NOTE — Care Plan (Signed)
[facility]  Occupational Therapy Progress Note    Patient Name: Terry Reid  Date of Birth: 1955/11/20  Height:  190.5 cm (6\' 3" )  Weight:  95 kg (209 lb 7 oz)  Room/Bed: 777/A  Payor: [employer] / Plan: [employer] / Product Type: Auto /     Assessment:    Pt tolerated OT session well this date. Pt completed EOB sitting with DEP assistance this date demonstrating poor postural and head control. Pt able to verbalize inconsistently during session at times able to communicate needs. Pt's family present during session and educated on PROM stretches this date. OT continues to recommend acute rehab placement at this time for TBI intervention.       Discharge Needs:   Equipment Recommendation: to be determined   Discharge Disposition:  inpatient rehabilitation facility     JUSTIFICATION OF DISCHARGE RECOMMENDATION   Based on current diagnosis, functional performance prior to admission, and current functional performance, this patient requires continued OT services in inpatient rehabilitation facility in order to achieve significant functional improvements.    Plan:   Continue to follow patient according to established plan of care.  The risks/benefits of therapy have been discussed with the patient/caregiver and he/she is in agreement with the established plan of care.     Subjective & Objective:        01/31/17 1044   Therapist Pager   OT Assigned/ Pager # Ysidro Evert [phone]   Rehab Session   Document Type therapy note (daily note)   Total OT Minutes: 40   Patient Effort good   Symptoms Noted During/After Treatment none   General Information   Patient Profile Reviewed? yes   Medical Lines PIV Line;Telemetry   Respiratory Status nasal cannula   Existing Precautions/Restrictions fall precautions;full code;weight bearing restriction   Pre Treatment Status   Pre Treatment Patient Status Patient sitting in bedside chair or w/c;Call light within reach;Telephone within reach;Venodynes in  place and activated   Support Present Pre Treatment  Nurse present   Mutuality/Individual Preferences   Patient Specific Interventions OOB with maxi-move, frequent repositioning in bed   Vital Signs   Vitals Comment VSS   Pain Assessment   Pre/Post Treatment Pain Comment Increased pain during Maxi Move and rolling to L side during positioning.    Coping/Psychosocial   Observed Emotional State accepting;calm;cooperative   Verbalized Emotional State acceptance   Coping/Psychosocial Response Interventions   Plan Of Care Reviewed With patient   Cognitive Assessment/Interventions   Behavior/Mood Observations lethargic   Orientation Status  unable/difficult to assess   Attention unable/difficult to assess   Follows Commands  other (see comments)  (< 25% accuracy following commands.)   Comment Pt's cognition waxing and waning throughout session this date. Pt at times able to follow single step commands and verbally respond. Pt able to verbally report that his back itches during session even localize specific location. Pt demonstrated increased response rate with family compared to therapists.    Mobility Assessment/Training   Extremity Weight-bearing Status left upper extremity;left lower extremity   Left Upper Extremity weight-bearing as tolerated (WBAT)   Left Lower Extremity weight-bearing as tolerated (WBAT)   Bed Mobility Assessment/Treatment   Supine-Sit Independence not tested   Sit to Supine, Independence dependent (less than 25% patient effort)   Safety Issues decreased use of arms for pushing/pulling;decreased use of legs for bridging/pushing;impaired trunk control for bed mobility;cognitive deficits limit understanding   Impairments balance impaired;cognition impaired;coordination impaired;motor control  impaired;postural control impaired;ROM decreased;strength decreased   Transfer Assessment/Treatment   Chair-Bed Independence dependent (less than 25% patient effort)   Bed-Chair-Bed Assist Device Maxi Move    Transfer Impairments balance impaired;cognition impaired;coordination impaired;endurance;motor control impaired;pain;postural control impaired;ROM decreased;strength decreased   Toileting Assessment/Training   Position supine   Independence Level  dependent (less than 25% patient effort)   Impairments balance impaired;cognition impaired;coordination impaired;motor control impaired;pain;postural control impaired;ROM decreased;strength decreased   Balance Skill Training   Comment Pt sat EOB with Ax1-2 this date. Pt requires assistance for postural and head control at this time.    Sitting Balance: Static unable to balance   Therapeutic Exercise/Activity   Comment Pt's family educated on PROM for extremities this date. Pt completed weight-shifting on UEs with assistance of OT and PT this date.    Post Treatment Status   Post Treatment Patient Status Patient supine in bed;Call light within reach;Telephone within reach;Venodynes in place and activated   Support Present Post Treatment  Family present  (RN updated)   Occupational Therapy Clinical Impression   Functional Level at Time of Session Pt tolerated OT session well this date. Pt completed EOB sitting with DEP assistance this date demonstrating poor postural and head control. Pt able to verbalize inconsistently during session at times able to communicate needs. Pt's family present during session and educated on PROM stretches this date. OT continues to recommend acute rehab placement at this time for TBI intervention.    Anticipated Equipment Needs at Discharge to be determined   Anticipated Discharge Disposition inpatient rehabilitation facility       Therapist:   Natasha Mead, OT  Pager #: [phone]

## 2017-01-31 NOTE — Care Plan (Signed)
[facility]  Physical Therapy Progress Note    Patient Name: Terry Reid  Date of Birth: [birth year]  Height:  190.5 cm (6\' 3" )  Weight:  95 kg (209 lb 7 oz)  Room/Bed: 777/A  Payor: [employer] / Plan: [employer] / Product Type: Auto /     Assessment:     Pt tol tx well. Pt alert intermittently during tx and had appropriate conversation at times, did not consistently repond to questions. Pt with response to painful stimuli in (R)UE and LE. Pt with difficulty staying awake to follow commands consistently during tx. Pt with great family support and would benefit from aggressive TBI rehab when medically ready.     Discharge Needs:   Equipment Recommendation: TBD    Discharge Disposition: inpatient rehabilitation facility    JUSTIFICATION OF DISCHARGE RECOMMENDATION   Based on current diagnosis, functional performance prior to admission, and current functional performance, this patient requires continued PT services in inpatient rehabilitation facility in order to achieve significant functional improvements in these deficit areas: arousal, attention, and cognition, aerobic capacity/endurance, gait, locomotion, and balance, ROM (range of motion), muscle performance.      Plan:   Continue to follow patient according to established plan of care.  The risks/benefits of therapy have been discussed with the patient/caregiver and he/she is in agreement with the established plan of care.     Subjective & Objective:        01/31/17 1043   Therapist Pager   PT Assigned/ Pager # Tavone Caesar w [phone]   Rehab Session   Document Type therapy note (daily note)   Total PT Minutes: 40   Patient Effort good   Symptoms Noted During/After Treatment none   General Information   Patient/Family Observations Pt seen bedside with OT for tx. Pt family present and supported. Pt lethargic but able to open eyes and make relevant statements during tx.   Mutuality/Individual Preferences   Patient Specific  Interventions OOB with maxi-move, frequent repositioning in bed   Pre Treatment Status   Pre Treatment Patient Status Patient sitting in bedside chair or w/c   Support Present Pre Treatment  Nurse present   Vital Signs   Vitals Comment VSS t/o tx and all positional changes   Pain Assessment   Pre/Post Treatment Pain Comment increased pain with maxi move transfers and when rolling to the (L)   Bed Mobility Assessment/Treatment   Sit to Supine, Independence dependent (less than 25% patient effort)   Safety Issues decreased use of arms for pushing/pulling;cognitive deficits limit understanding;decreased use of legs for bridging/pushing;impaired trunk control for bed mobility   Comment  Pt sat EOB via maxi move. Pt tol sitting ~30min with no negative response. Pt (D) to return to supine.   Transfer Assessment/Treatment   Chair-Bed Independence dependent (less than 25% patient effort)   Bed-Chair-Bed Assist Device Maxi Move   Therapeutic Exercise/Activity   Comment Pt family educated on PROM to (R)UE and LE. Worked with pt on sitting balance and head control at EOB. Pt required total (A) for both at this time.    Post Treatment Status   Post Treatment Patient Status Patient supine in bed;Call light within reach;Sitter select activated  ((L)sidelying)   Support Present Post Treatment  Family present   Physical Therapy Clinical Impression   Assessment Pt tol tx well. Pt alert intermittently during tx and had appropriate conversation at times, did not consistently repond to questions. Pt with response to  painful stimuli in (R)UE and LE. Pt with difficulty staying awake to follow commands consistently during tx. Pt with great family support and would benefit from aggressive TBI rehab when medically ready.    Anticipated Equipment Needs at Discharge (PT Clinical Impression) TBD   Anticipated Discharge Disposition  inpatient rehabilitation facility       Therapist:   Ernst Bowler, PTA 01/31/2017 11:01  Pager #: [phone]

## 2017-01-31 NOTE — Nurses Notes (Signed)
Patients BP after IV hydralazine 157/83. Will monitor.

## 2017-01-31 NOTE — Ancillary Notes (Signed)
SBIRT    SBIRT not completed at this time due to patient being with physical therapy during time of attempt. Pending recommendations:  Attempt to complete SBIRT at later time/date.    Rollene Rotunda, LGSW Clinical Therapist 01/31/2017, 10:58    Pager  [phone]

## 2017-01-31 NOTE — Care Plan (Signed)
Problem: Patient Care Overview (Adult,OB)  Goal: Plan of Care Review(Adult,OB)  The patient and/or their representative will communicate an understanding of their plan of care   Outcome: Ongoing (see interventions/notes)  Patient rested in between care throughout the night. Fall precautions and sitter select remains in place for safety.Patient continues to be oriented to self only. PO antibiotics continued for aspiration pneumonia. Percussion and vibration being completed as ordered. Turning and repositioning patient q2 hours to maintain skin integrity. Calorie count initiated and family participating in encouraging PO intake. Patient tolerating tube feeds as ordered. Plan is for patient to be discharged to rehab.

## 2017-01-31 NOTE — Care Plan (Signed)
[facility]  Speech Therapy Progress Note    Patient Name: Terry Reid  Date of Birth: 1956/05/12  Weight:  95 kg (209 lb 7 oz)  Room/Bed: 777/A  Payor: [employer] / Plan: [employer] / Product Type: Auto /      Date/Time of Admission: 01/24/2017 10:52 AM  Admitting Diagnosis:  Trauma [T14.90XA]        Assessment:  No overt s/s aspiration with consistencies presented in this setting. Pt continues to be lethargic required intermittent stimulation to maintain alertness for PO. Oropharyngeal swallow WFL for Puree consistency foods and Nectar thickened liquids.     JUSTIFICATION OF DISCHARGE RECOMMENDATION  Based on current diagnosis, functional performance prior to admission, and current  functional performance, this patient requires continued Speech services   in [facility] in order to achieve significant functional improvements in these   deficit areas: Speech and Language and Swallowing.      Plan:  Recommendations: Recommend Puree consistency foods and Nectar thickened liquids with assistance and adherence to aspiration precautions: upright and alert for all PO intake, small drinks and bites, adjust rate of PO to pt's needs, and remain upright for 30+ minutes following meals. Recommend speech/language evaluation and intervention.   Results & Recommendations Discussed With:Patient, Nurse and MD   Continue to follow patient according to established plan of care.  The risks/benefits of therapy have been discussed with the patient/caregiver and he/she is in agreement with the established plan of care.       Subjective:  Pt awake; upright in bed. Lethargic; intermittent verbal stimulation provided to maintain alertness for PO intake. Family present assisting pt with lunch.     Objective:  Pt accepted ~2 oz nectar thickened ensure, ~4 oz nectar thickened tea, ~2 oz pudding, and ~1 oz magic cup. Did not present regular consistency liquids or solids this session  given current cognitive status placing pt at higher risk for aspiration. Moderate cues for PO acceptance. Fair stripping of spoon. Laryngeal elevation appreciated. No cough, throat clear, or change in vocal quality following PO. Reviewed recommendations and aspiration precautions with family. Expressive deficits noted. Echolalia and perseveration present as pt repeated after family and therapist.       Therapist:  Knox Royalty, SLP   Pager #: [phone]  Phone #: [phone]  Treatment Time: 15 minutes

## 2017-01-31 NOTE — Care Plan (Signed)
Problem: Patient Care Overview (Adult,OB)  Goal: Plan of Care Review(Adult,OB)  The patient and/or their representative will communicate an understanding of their plan of care   Outcome: Ongoing (see interventions/notes)    Medical Nutrition Therapy Calorie Count Note  I: Estimated Needs:  Energy Calorie Requirements: 2350-2550 kcal/day (28-30 kcal/kg 84.5 BW)   Protein Requirements (gms/day): 152-169 g/day (1.8-2.0 g/kg 84.5 BW)   Fluid Requirements: 2350-2550 ml/day (28-30 ml/kg 84.5 BW)     Calorie Count day 1: 745 calories, 18 grams of protein, which met 32 % of estimated calorie needs and 12 % of estimated protein needs.      P: Pt pulled NG this morning.  Per trauma OK to not replace.  TF d/c.  Encourage oral intake, supplements ordered.  Calorie count to continue for 2 more days.    Carmie Kanner, RDLD  01/31/2017, 09:25

## 2017-01-31 NOTE — Nurses Notes (Signed)
Patients manual BP 182/90. Dr. Donneta Romberg paged, awaiting orders. Will continue to monitor.

## 2017-01-31 NOTE — Care Management Notes (Signed)
[facility] Management Note    Patient Name: Terry Reid  Date of Birth: [birth year]  Sex: male  Date/Time of Admission: 01/24/2017 10:52 AM  Room/Bed: 777/A  Payor: [employer] / Plan: [employer] / Product Type: Auto /    LOS: 7 days   PCP: Dagoberto Reef, MD    Admitting Diagnosis:  Trauma [T14.90XA]    Assessment:      01/31/17 1543   Assessment Details   Assessment Type Continued Assessment   Date of Care Management Update 01/31/17   Date of Next DCP Update 02/03/17   Care Management Plan   Discharge Planning Status plan in progress   Projected Discharge Date 02/03/17   CM will evaluate for rehabilitation potential yes   Patient choice offered to patient/family yes   Form for patient choice reviewed/signed and on chart no   Patient aware of possible cost for ambulance transport?  Yes   Discharge Needs Assessment   Discharge Facility/Level Of Care Needs Acute Rehab Placement/Return (not psych)(code 62)   Transportation Available ambulance     Per service, performing calorie count, monitoring BP, PT/OT recommending acute rehab.  MSW met with wife and family at bedside, per wife she is trying to make arrangements with her employer and determine if they can afford ambulance transport to [place] for rehab.  No choice was provided at this time.  Will continue to follow for d/c needs.    Discharge Plan:  Acute Rehab Placement/Return (not psych) (code 59)      The patient will continue to be evaluated for developing discharge needs.     Case Manager: Evalina Field  Phone: [phone]

## 2017-01-31 NOTE — Care Plan (Signed)
Problem: Patient Care Overview (Adult,OB)  Goal: Plan of Care Review(Adult,OB)  The patient and/or their representative will communicate an understanding of their plan of care   Outcome: Ongoing (see interventions/notes)  Plan of care reviewed with patient and family. Patient confused, sitter select and video monitoring in place for safety. NG removed. Patient tolerating diet with poor appetite noted. Calorie Count in place. Patient fed for all meals.OOB to chair with maxi move. Turned q2h and prn. Appears free from pain or discomfort. Family at bedside and actively participating in care. Discharge planning in progress. Will monitor.   Goal: Individualization/Patient Specific Goal(Adult/OB)  Outcome: Ongoing (see interventions/notes)    Goal: Interdisciplinary Rounds/Family Conf  Outcome: Ongoing (see interventions/notes)      Problem: Skin Injury Risk (Adult,Obstetrics,Pediatric)  Goal: Skin Health and Integrity  Patient will demonstrate the desired outcomes by discharge/transition of care.   Outcome: Ongoing (see interventions/notes)      Problem: Fall Risk (Adult)  Goal: Absence of Falls  Patient will demonstrate the desired outcomes by discharge/transition of care.   Outcome: Ongoing (see interventions/notes)      Problem: Brain Injury, Severe Traumatic (GCS 8 or less) (Adult)  Prevent and manage potential problems including:  1. acute neurologic deterioration  2. embolism  3. fluid/electrolyte imbalance  4. functional deficit/sensory impairment/immobility  5. gastrointestinal complications  6. hemodynamic instability  7. hypercatabolism  8. hyperglycemia  9. hypoxia/hypoxemia  10. infection  11. pain  12. situational response  13. skin breakdown   Goal: Signs and Symptoms of Listed Potential Problems Will be Absent, Minimized or Managed (Brain Injury, Severe Traumatic)  Signs and symptoms of listed potential problems will be absent, minimized or managed by discharge/transition of care (reference Brain Injury,  Severe Traumatic (GCS 8 or less) (Adult) CPG).     Outcome: Ongoing (see interventions/notes)      Problem: Nutrition, Enteral (Adult)  Prevent and manage potential problems including:  1. adverse events  2. aspiration  3. fluid/electrolyte imbalance  4. gastrointestinal complications  5. malnutrition  6. mechanical complications  7. skin/mucosal integrity impairment   Goal: Signs and Symptoms of Listed Potential Problems Will be Absent, Minimized or Managed (Nutrition, Enteral)  Signs and symptoms of listed potential problems will be absent, minimized or managed by discharge/transition of care (reference Nutrition, Enteral (Adult) CPG).     Outcome: Outcome Achieved Date Met: 01/31/17

## 2017-01-31 NOTE — Nurses Notes (Signed)
Orders received for PRN 10mg  IV Hydralazine for elevated BP. Administered per MAR. Will continue to monitor.
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
## 2017-02-01 DIAGNOSIS — G908 Other disorders of autonomic nervous system: Secondary | ICD-10-CM

## 2017-02-01 LAB — POC BLOOD GLUCOSE (RESULTS)
GLUCOSE, POC: 109 mg/dL — ABNORMAL HIGH (ref 70–105)
GLUCOSE, POC: 124 mg/dL — ABNORMAL HIGH (ref 70–105)
GLUCOSE, POC: 190 mg/dL — ABNORMAL HIGH (ref 70–105)
GLUCOSE, POC: 98 mg/dL (ref 70–105)

## 2017-02-01 LAB — ADULT ROUTINE BLOOD CULTURE, SET OF 2 BOTTLES (BACTERIA AND YEAST)
BLOOD CULTURE, ROUTINE: NO GROWTH
BLOOD CULTURE, ROUTINE: NO GROWTH

## 2017-02-01 MED ORDER — SENNA LEAF EXTRACT 176 MG/5 ML ORAL SYRUP
5.00 mL | ORAL_SOLUTION | Freq: Two times a day (BID) | ORAL | Status: DC | PRN
Start: 2017-02-01 — End: 2017-02-12
  Filled 2017-02-01: qty 15

## 2017-02-01 MED ORDER — DOCUSATE SODIUM 50 MG/5 ML ORAL LIQUID
100.00 mg | Freq: Every day | ORAL | Status: DC | PRN
Start: 2017-02-01 — End: 2017-02-12
  Filled 2017-02-01: qty 10

## 2017-02-01 MED ADMIN — famotidine 20 mg tablet: ORAL | @ 09:00:00

## 2017-02-01 MED ADMIN — desipramine 50 mg tablet: ORAL | @ 21:00:00

## 2017-02-01 MED ADMIN — propranoloL 40 mg tablet: ORAL | @ 21:00:00

## 2017-02-01 MED ADMIN — lactated Ringers intravenous solution: ORAL | @ 21:00:00 | NDC 00338011704

## 2017-02-01 MED ADMIN — erythromycin 5 mg/gram (0.5 %) eye ointment: ORAL | @ 18:00:00 | NDC 24208091019

## 2017-02-01 MED ADMIN — lactated Ringers intravenous solution: SUBCUTANEOUS | @ 17:00:00 | NDC 00338011704

## 2017-02-01 NOTE — Nurses Notes (Signed)
FS: 190mg /dL; 2U PRN Humulin R administered. Will continue to monitor.

## 2017-02-01 NOTE — Nurses Notes (Signed)
FS 109- no coverage needed per order. Will continue to monitor.     Lawson Fiscal, RN  02/01/2017, 07:33

## 2017-02-01 NOTE — Nurses Notes (Signed)
Clement Husbands paged regarding SIRS/Sepsis alert.

## 2017-02-01 NOTE — Respiratory Therapy (Signed)
Paged service Trauma Blue about pts Respiratory Parameters pts effort was poor. Pt was sitting up in chair at the time the parameters was done

## 2017-02-01 NOTE — Care Plan (Signed)
Problem: Patient Care Overview (Adult,OB)  Goal: Plan of Care Review(Adult,OB)  The patient and/or their representative will communicate an understanding of their plan of care   Outcome: Ongoing (see interventions/notes)  Patient tolerates turning/repositioning in bed and up to chair x 2 this shift with Maxi Move, SS/VM/ and Fall precautions maintained for safety and continued care. Cooperative when asked to perform AROM exercises. Remains only oriented to self, but is more talkative.Tolerating a regular/pureed, nectar thickened liquid diet with continued calorie count; LBM 02/01/2017 and voids spontaneously without difficulty. No signs or complaints of pain at this time. Preventative border dressings on heels and sacrum; dressings to LUE/LLE intact and rewrapped as needed. Patient tolerates P/V when not in chair, uses IS well to 1243mL and maintaining O2 sats on RA. Discharge planning ongoing.   Goal: Individualization/Patient Specific Goal(Adult/OB)  Outcome: Ongoing (see interventions/notes)      Problem: Fall Risk (Adult)  Goal: Absence of Falls  Patient will demonstrate the desired outcomes by discharge/transition of care.   Outcome: Ongoing (see interventions/notes)      Problem: Fracture Orthopaedic (Adult)  Prevent and manage potential problems including:  1. bleeding  2. delayed union/nonunion  3. embolism  4. functional deficit/self-care deficit  5. gastrointestinal complications  6. infection  7. neurovascular impairment/injury  8. pain  9. respiratory compromise  10. situational response  11. skin integrity impairment  12. urinary retention  Goal: Signs and Symptoms of Listed Potential Problems Will be Absent, Minimized or Managed (Fracture Orthopaedic)  Signs and symptoms of listed potential problems will be absent, minimized or managed by discharge/transition of care (reference Fracture Orthopaedic (Adult) CPG).  Outcome: Ongoing (see interventions/notes)
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
## 2017-02-01 NOTE — Nurses Notes (Signed)
Results for Terry Reid, Terry Reid (MRN [id number])    Ref. Range 02/01/2017 12:07   GLUCOSE, POC Latest Ref Range: 70 - 105 mg/dL 124 (H)     No coverage needed; will continue to monitor.

## 2017-02-01 NOTE — Respiratory Therapy (Signed)
02/01/17 0822       Respiratory Parameters   Start Time 0822   1st Attempt 0.66 Liters   2nd Attempt 0.54 Liters   3rd Attempt 0.3 Liters   Average FVC 0.5 Liters   1st Attempt -40 cmH2O   2nd Attempt -30 cmH2O   3rd Attempt -20 cmH2O   Average NIF -30 cmH2O   Respiratory Effort Poor   $Parameters (Resp only) Completed   Stop Time 0834   Duration 12 Minutes
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
## 2017-02-01 NOTE — Consults (Signed)
Orthopaedic Trauma Progress Note    02/01/2017  8 Days Post-Op S/P I&D L knee and wrist     Subjective: awake in bed, following commands, answering questions     Labs:   BMP:     No results found for this encounter  CBC Results Differential Results   No results found for this encounter Recent Results (from the past 30 hour(s))   MANUAL DIFFERENTIAL (CELLAVISION)    Collection Time: 01/31/17  4:09 AM   Result Value    MYELOCYTES 1 (H)    BANDS 1    NEUTROPHIL % 73    LYMPHOCYTE % 16    MONOCYTE % 4    EOSINOPHIL % 3    BASOPHIL % 1    BASOPHIL # 0.16   CBC WITH DIFF    Collection Time: 01/31/17  4:09 AM   Result Value    WBC 17.9 (H)          Physical Exam: Blood pressure 112/84, pulse 63, temperature 36.3 C (97.3 F), resp. rate (!) 27, height 1.905 m (6\' 3" ), weight 95 kg (209 lb 7 oz), SpO2 91 %.  LUE/LLE  Dressings changed, incisions dry  No significant swelling to extremities  Palpable radial, PT pulse  Was able to give thumbs up, cross fingers, spread fingers      Assessment/ Plan:   Terry Reid is a 62 y.o. male S/P I&D L knee and elbow arthrotomy  WBAT LLE, LUE  Please place pillow under L heel to prevent knee flexion contracture while lying in bed  Work on passive ROM  Care per primary  No limitations based on [place], MD  02/01/2017, 05:36  PGY-3 [place]  PAGER # [phone]

## 2017-02-02 LAB — POC BLOOD GLUCOSE (RESULTS)
GLUCOSE, POC: 88 mg/dL (ref 70–105)
GLUCOSE, POC: 113 mg/dL — ABNORMAL HIGH (ref 70–105)
GLUCOSE, POC: 106 mg/dL — ABNORMAL HIGH (ref 70–105)
GLUCOSE, POC: 133 mg/dL — ABNORMAL HIGH (ref 70–105)

## 2017-02-02 MED ORDER — ALBUTEROL SULFATE 2.5 MG/3 ML (0.083 %) SOLUTION FOR NEBULIZATION
2.50 mg | INHALATION_SOLUTION | Freq: Four times a day (QID) | RESPIRATORY_TRACT | Status: DC | PRN
Start: 2017-02-02 — End: 2017-02-12
  Administered 2017-02-03 (×2): 2.5 mg via RESPIRATORY_TRACT

## 2017-02-02 MED ADMIN — acetaminophen 325 mg tablet: ORAL

## 2017-02-02 MED ADMIN — cloNIDine HCL 0.1 mg tablet: ORAL | @ 21:00:00

## 2017-02-02 MED ADMIN — ondansetron 4 mg disintegrating tablet: ORAL | @ 21:00:00

## 2017-02-02 MED ADMIN — lactated Ringers intravenous solution: ORAL | @ 09:00:00 | NDC 00338011704

## 2017-02-02 MED ADMIN — nystatin 100,000 unit/gram topical powder: ORAL | @ 09:00:00 | NDC 00574200815
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
## 2017-02-02 NOTE — Respiratory Therapy (Signed)
02/02/17 0917       Respiratory Parameters   Start Time 0917   1st Attempt 0.74 Liters   2nd Attempt 0.67 Liters   3rd Attempt 0.67 Liters   Average FVC 0.7 Liters   1st Attempt -40 cmH2O   2nd Attempt -40 cmH2O   3rd Attempt -30 cmH2O   Average NIF -37 cmH2O   Respiratory Effort Poor   $Parameters (Resp only) Completed   Stop Time 0920   Duration 3 Minutes

## 2017-02-03 ENCOUNTER — Inpatient Hospital Stay (HOSPITAL_COMMUNITY): Payer: BC Managed Care – PPO

## 2017-02-03 DIAGNOSIS — R0602 Shortness of breath: Secondary | ICD-10-CM

## 2017-02-03 DIAGNOSIS — R0902 Hypoxemia: Secondary | ICD-10-CM

## 2017-02-03 DIAGNOSIS — J9691 Respiratory failure, unspecified with hypoxia: Secondary | ICD-10-CM

## 2017-02-03 LAB — ARTERIAL BLOOD GAS/CO-OX
%FIO2 (ARTERIAL): 21 %
BASE DEFICIT: 1.4 mmol/L (ref 0.0–3.0)
BICARBONATE (ARTERIAL): 23.7 mmol/L (ref 18.0–26.0)
CARBOXYHEMOGLOBIN: 2.4 % (ref 0.0–2.5)
HEMOGLOBIN: 14.3 g/dL (ref 12.0–18.0)
MET-HEMOGLOBIN: 1.2 % (ref 0.0–3.5)
O2CT: 18.5 % (ref 15.7–24.3)
OXYHEMOGLOBIN: 92.1 % (ref 85.0–98.0)
PAO2/FIO2 RATIO: 290 (ref <=200)
PCO2 (ARTERIAL): 30 mmHg — ABNORMAL LOW (ref 35.0–45.0)
PH (ARTERIAL): 7.46 — ABNORMAL HIGH (ref 7.35–7.45)
PO2 (ARTERIAL): 61 mmHg — ABNORMAL LOW (ref 72.0–100.0)

## 2017-02-03 LAB — VENOUS BLOOD GAS/LACTATE
%FIO2 (VENOUS): 36 %
BASE EXCESS: 0.1 mmol/L (ref <=3.0)
BASE EXCESS: 0.1 mmol/L (ref ?–3.0)
BICARBONATE (VENOUS): 24.5 mmol/L (ref 22.0–26.0)
LACTATE: 1.4 mmol/L — ABNORMAL HIGH (ref 0.0–1.3)
PCO2 (VENOUS): 35 mmHg — ABNORMAL LOW (ref 41.00–51.00)
PH (VENOUS): 7.44 — ABNORMAL HIGH (ref 7.31–7.41)
PO2 (VENOUS): 40 mmHg (ref 35.0–50.0)

## 2017-02-03 LAB — POC BLOOD GLUCOSE (RESULTS)
GLUCOSE, POC: 118 mg/dL — ABNORMAL HIGH (ref 70–105)
GLUCOSE, POC: 129 mg/dL — ABNORMAL HIGH (ref 70–105)
GLUCOSE, POC: 106 mg/dL — ABNORMAL HIGH (ref 70–105)
GLUCOSE, POC: 123 mg/dL — ABNORMAL HIGH (ref 70–105)

## 2017-02-03 LAB — ECG 12-LEAD
Atrial Rate: 82 {beats}/min
Calculated P Axis: 60 degrees
Calculated R Axis: 31 degrees
Calculated T Axis: 87 degrees
PR Interval: 140 ms
QRS Duration: 86 ms
QT Interval: 398 ms
QTC Calculation: 464 ms
Ventricular rate: 82 {beats}/min

## 2017-02-03 LAB — TROPONIN-I: TROPONIN I: <7 ng/L (ref 0–30)

## 2017-02-03 MED ORDER — ACETAMINOPHEN 325 MG TABLET
975.00 mg | ORAL_TABLET | Freq: Three times a day (TID) | ORAL | Status: DC
Start: 2017-02-03 — End: 2017-02-12
  Administered 2017-02-03 – 2017-02-12 (×26): 975 mg via ORAL
  Filled 2017-02-03 (×27): qty 3

## 2017-02-03 MED ORDER — HYDROCODONE 5 MG-ACETAMINOPHEN 325 MG TABLET
2.0000 | ORAL_TABLET | Freq: Three times a day (TID) | ORAL | Status: DC | PRN
Start: 2017-02-03 — End: 2017-02-05

## 2017-02-03 MED ORDER — HYDROCODONE 5 MG-ACETAMINOPHEN 325 MG TABLET
1.0000 | ORAL_TABLET | Freq: Three times a day (TID) | ORAL | Status: DC | PRN
Start: 2017-02-03 — End: 2017-02-07

## 2017-02-03 MED ORDER — CLONIDINE HCL 0.1 MG TABLET
0.10 mg | ORAL_TABLET | Freq: Two times a day (BID) | ORAL | Status: DC
Start: 2017-02-03 — End: 2017-02-06
  Administered 2017-02-03 – 2017-02-05 (×5): 0.1 mg via ORAL
  Filled 2017-02-03 (×7): qty 1

## 2017-02-03 MED ADMIN — nicotine 21 mg/24 hr daily transdermal patch: @ 07:00:00

## 2017-02-03 NOTE — Nurses Notes (Signed)
Text page sent to Perrin Smack, on call for Trauma Blue:     734, [place]-  pt's O2 sats 90-95% on RA. Received bath, and he desatted to 85%. Maintaining 90-91% on 6 LNC. Do you want chest x-ray or VBG?  -[place], RN  02/03/2017, 05:48
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
## 2017-02-03 NOTE — Nurses Notes (Signed)
Patient's O2 sats currently 90-92% on 1 LNC. Percussed/vibrated by specialty bed, as ordered. Patient tolerated well. Lungs sound clear/diminished in bases. Patient appears to be in no distress, and denies any shortness of breath or chest pain. Will continue to monitor.    Evans Lance, RN  02/03/2017, 20:19

## 2017-02-03 NOTE — Care Plan (Addendum)
Problem: Patient Care Overview (Adult,OB)  Goal: Plan of Care Review(Adult,OB)  The patient and/or their representative will communicate an understanding of their plan of care   Outcome: Ongoing (see interventions/notes)      Medical Nutrition Therapy Calorie Count Note    I: Estimated Needs:  Energy Calorie Requirements: 2350-2550 kcal/day (28-30 kcal/kg 84.5 BW)  Protein Requirements (gms/day): 110-127 g/day (1.3-1.5 g/kg 84.5 BW)  Fluid Requirements: 2350-2550 ml/day (28-30 ml/kg 84.5 BW)    Calorie Count day 2: 958 calories, 62 grams of protein, which met 41 % of estimated calorie needs and 56 % of estimated protein needs.     Calorie Count day 3: 1998 calories, 76 grams of protein, which met 85 % of estimated calorie needs and 69 % of estimated protein needs.     Calorie Count 3 day average: 1234 calories, 52 grams of protein, which met 53 % of estimated calorie needs and 47 % of estimated protein needs.     Pt eating a few bites of magic cups at almost every meal.  Intake greatly improved on the final day of the  Calorie count.  Appears he eats better with family assistance.    P: Continue supplements of Magic Cup and Ensure as ordered.  Provide assistance with all meals.  Continue prosource BID, pt has not been refusing it so will continue as it greatly contributes to his protein intake daily.    Carmie Kanner, RDLD  02/03/2017, 10:41

## 2017-02-03 NOTE — Care Plan (Signed)
[place]  Physical Therapy Progress Note      Patient Name: Terry Reid  Date of Birth: 1955/11/03  Height:  190.5 cm (6\' 3" )  Weight:  95 kg (209 lb 7 oz)  Room/Bed: 777/A  Payor: [employer] / Plan: [employer] / Product Type: Auto /     Assessment:     Fair tolerance to PT treatment this date. Pt with increased fatigue and increased agitation this date. Continues to require dep assist x 2 for all mobility. Able to withdraw to pain in RLE this date. Will continue to follow pt and progress as tolerated.    Discharge Needs:   Equipment Recommendation: TBD    Discharge Disposition: inpatient rehabilitation facility    JUSTIFICATION OF DISCHARGE RECOMMENDATION   Based on current diagnosis, functional performance prior to admission, and current functional performance, this patient requires continued PT services in inpatient rehabilitation facility in order to achieve significant functional improvements in these deficit areas: arousal, attention, and cognition, aerobic capacity/endurance, gait, locomotion, and balance, ROM (range of motion), muscle performance.      Plan:   Continue to follow patient according to established plan of care.  The risks/benefits of therapy have been discussed with the patient/caregiver and he/she is in agreement with the established plan of care.     Subjective & Objective:        02/03/17 1357   Therapist Pager   PT Assigned/ Pager # ev [phone]   Rehab Session   Document Type therapy note (daily note)   Total PT Minutes: 25   Patient Effort fair   Symptoms Noted During/After Treatment fatigue   Symptoms Noted Comment pt with eyes closed most of session; reports increased fatigue this date   General Information   Patient Profile Reviewed? yes   Patient/Family Observations Pt seen bedside this date, supine in bed, RN approved session, seen with professional assist of OT   Medical Lines PIV Line;Telemetry   Respiratory Status room air   Existing  Precautions/Restrictions fall precautions;full code;weight bearing restriction   Weight-bearing Status   Extremity Weight-bearing Status left upper extremity;left lower extremity   Left Upper Extremity weight-bearing as tolerated (WBAT)   Left Lower Extremity weight-bearing as tolerated (WBAT)   Mutuality/Individual Preferences   Patient Specific Interventions OOB with maxi move   Pre Treatment Status   Pre Treatment Patient Status Patient supine in bed;Call light within reach;Telephone within reach;Sitter select activated   Support Present Pre Treatment  Family present   Cognitive Assessment/Interventions   Comment Pt with more confusing conversation this date; also more agitated this date   Pain Assessment   Pre/Post Treatment Pain Comment reports pain in RUE and states that his R shoulder hurts when moving his L leg - unable to rate   Bed Mobility Assessment/Treatment   Bed Mobility, Assistive Device draw sheet;Head of Bed Elevated   Supine-Sit Independence dependent (less than 25% patient effort);2 person assist required   Sit to Supine, Independence dependent (less than 25% patient effort);2 person assist required   Safety Issues cognitive deficits limit understanding;decreased use of arms for pushing/pulling;decreased use of legs for bridging/pushing;impaired trunk control for bed mobility   Impairments balance impaired;cognition impaired;coordination impaired;endurance;pain;postural control impaired;ROM decreased;strength decreased   Comment  assist provided for upper trunk and BLEs; static sitting on EOB approx 5-10 min; required dep assist for sitting balance and head control   Scoot/Bridge Independence  dependent (less than 25% patient effort);2 person assist required  Balance Skill Training   Sitting Balance: Static poor balance   Sitting, Dynamic (Balance) unable to balance   Therapeutic Exercise/Activity   Comment PROM to BLEs with gentle end range stretching; pt able to withdraw to pain in RLE this  date       Orthotics/Misc Device    [employer]s [employer]   Post Treatment Status   Post Treatment Patient Status Patient supine in bed;Call light within reach;Sitter select activated   Support Present Post Treatment  Family present   Plan of Care Review   Plan Of Care Reviewed With patient;family   Physical Therapy Clinical Impression   Assessment Fair tolerance to PT treatment this date. Pt with increased fatigue and increased agitation this date. Continues to require dep assist x 2 for all mobility. Able to withdraw to pain in RLE this date. Will continue to follow pt and progress as tolerated.   Anticipated Equipment Needs at Discharge (PT Clinical Impression) TBD   Anticipated Discharge Disposition  inpatient rehabilitation facility       Therapist:   Blanche East, [place]   Pager #: [phone]

## 2017-02-03 NOTE — Ancillary Notes (Signed)
SBIRT    SBIRT not completed at this time due to patient's medical status - DO x3  Pending recommendations:  Attempt to complete SBIRT at later time/date.    Opal Sidles, STUDENT PSYCHOLOGY Clinical Therapist 02/03/2017, 08:36  Pager  [phone]    Riley Nearing. Verdell Carmine , Clinical Therapist 02/04/2017, 13:50  Pager # [phone]

## 2017-02-03 NOTE — Nurses Notes (Signed)
Patient's O2 sats 92-93% on 2 LNC. Patient showing no signs/symptoms of distress. Will monitor.     Evans Lance, RN  02/03/2017, 06:46

## 2017-02-03 NOTE — Nurses Notes (Signed)
Spoke with Joni Fears, with Trauma. Explained to her that the patient was previously on room air, with O2 sats between 90-95%. Patient received bath/incontinence care around 0530, and tolerated well, but after the bath, his sats decreased to 84-85% on room air. Placed patient on 6 LNC, and he maintained 90-91%. Patient showing no signs of distress, and denies shortness of breath/chest pain. Lungs sound clear/diminished. Respiratory paged to administer PRN albuterol treatment. After treatment, patients sats improved to 95-96%. She ordered a VBG, stat chest x-ray, and stat EKG, and instructed to wean the patient's NC as he tolerates it. Will continue to monitor.     Evans Lance, RN  02/03/2017, 06:00

## 2017-02-03 NOTE — Nurses Notes (Addendum)
Respiratory paged to administer PRN albuterol treatment.     Evans Lance, RN  02/03/2017, 05:45    Patient's O2 sats 96% on 6 LNC after albuterol treatment.     Evans Lance, RN  02/03/2017, 05:56

## 2017-02-03 NOTE — Care Plan (Signed)
[place]  Occupational Therapy Progress Note    Patient Name: Terry Reid  Date of Birth: 16-Apr-1956  Height:  190.5 cm (6\' 3" )  Weight:  95 kg (209 lb 7 oz)  Room/Bed: 777/A  Payor: [employer] / Plan: [employer] / Product Type: Auto /     Assessment:    Pt more agitated today and needing much encouragement and cueing to participate. Pt limited by fatigue and lethargy. Continue to recommend inpt TBI rehab.       Discharge Needs:   Equipment Recommendation: to be determined    Discharge Disposition:  inpatient rehabilitation facility     JUSTIFICATION OF DISCHARGE RECOMMENDATION   Based on current diagnosis, functional performance prior to admission, and current functional performance, this patient requires continued OT services in inpatient rehabilitation facility in order to achieve significant functional improvements.    Plan:   Continue to follow patient according to established plan of care.  The risks/benefits of therapy have been discussed with the patient/caregiver and he/she is in agreement with the established plan of care.     Subjective & Objective:        02/03/17 1358   Therapist Pager   OT Assigned/ Pager # Ebony Hail [phone]   Rehab Session   Document Type therapy note (daily note)   Total OT Minutes: 25   Patient Effort fair   Symptoms Noted During/After Treatment fatigue   General Information   Medical Lines PIV Line;Telemetry   Respiratory Status room air   Existing Precautions/Restrictions fall precautions;full code;weight bearing restriction   Pre Treatment Status   Pre Treatment Patient Status Patient supine in bed;Call light within reach;Telephone within reach;Sitter select activated   Support Present Pre Treatment  Family present  (spoke to nurse prior to session; co-tx with PT)   Mutuality/Individual Preferences   Patient Specific Interventions OOB with maxi move   Pain Assessment   Pre/Post Treatment Pain Comment Pain in R shoulder reported while  assisting pt with L LE ROM, unsure of accuracy of pain complaint   Coping/Psychosocial Response Interventions   Plan Of Care Reviewed With patient;family   Cognitive Assessment/Interventions   Behavior/Mood Observations alert;flat affect;lethargic;agitated   Comment Pt lethargic initially, then became more alert with activity, however quickly returned to lethargic state near end of session. Pt making confused statements at times. Agitated at times.    Vision Assessment/Interventions   Visual Impairment/Limitations (eyes opened briefly, otherwise remained closed)   Mobility Assessment/Training   Extremity Weight-bearing Status left upper extremity;left lower extremity   Left Upper Extremity weight-bearing as tolerated (WBAT)   Left Lower Extremity weight-bearing as tolerated (WBAT)   Bed Mobility Assessment/Treatment   Bed Mobility, Assistive Device draw sheet;Head of Bed Elevated   Supine-Sit Independence dependent (less than 25% patient effort);2 person assist required;verbal cues required   Sit to Supine, Independence dependent (less than 25% patient effort);2 person assist required   Comment  Pt sat EOB ~10 min, dependent for balance; max A to hold head upright.    Scoot/Bridge Independence  dependent (less than 25% patient effort);2 person assist required   Grooming Assessment/Training   Comment hand over hand max A to wash face using L UE   Self-Feeding Assessment/Training   Comment wife reports pt took spoon out of her hand using his L hand when she was assisting him with feeding earlier today   Balance Skill Training   Sitting Balance: Static poor balance   Therapeutic Exercise/Activity  Comment PROM performed B UE       Orthotics/Misc Device    Boots On;[place] [employer]  (R LE)   Post Treatment Status   Post Treatment Patient Status Patient supine in bed;Call light within reach;Sitter select activated   Support Present Post Treatment  Family present   Occupational Therapy Clinical Impression   Functional Level  at Time of Session Pt more agitated today and needing much encouragement and cueing to participate. Pt limited by fatigue and lethargy. Continue to recommend inpt TBI rehab.    Anticipated Equipment Needs at Discharge to be determined   Anticipated Discharge Disposition inpatient rehabilitation facility       Therapist:   Meyer Russel, OT  Pager #: [phone]

## 2017-02-04 ENCOUNTER — Inpatient Hospital Stay (HOSPITAL_COMMUNITY): Payer: BC Managed Care – PPO

## 2017-02-04 DIAGNOSIS — R0602 Shortness of breath: Secondary | ICD-10-CM

## 2017-02-04 MED ADMIN — sodium chloride 0.9 % (flush) injection syringe: @ 22:00:00

## 2017-02-04 MED ADMIN — acetaminophen 325 mg tablet: ORAL | @ 22:00:00

## 2017-02-04 MED ADMIN — propranoloL 40 mg tablet: ORAL | @ 14:00:00

## 2017-02-04 MED ADMIN — sodium chloride 0.9 % (flush) injection syringe: @ 14:00:00

## 2017-02-04 NOTE — Nurses Notes (Signed)
Trauma Blue service at bedside, stated that it would be ok to discontinue pt telemetry due to his heart rate being stable and him pulling it off continuously.

## 2017-02-04 NOTE — Care Management Notes (Signed)
[place] Management Note    Patient Name: Terry Reid  Date of Birth: 10/20/1955  Sex: male  Date/Time of Admission: 01/24/2017 10:52 AM  Room/Bed: 777/A  Payor: [employer] / Plan: [employer] / Product Type: Auto /    LOS: 11 days   PCP: Dagoberto Reef, MD    Admitting Diagnosis:  Trauma [T14.90XA]    Assessment:      02/04/17 1115   Assessment Details   Assessment Type Continued Assessment   Date of Care Management Update 02/04/17   Date of Next DCP Update 02/07/17   Care Management Plan   Discharge Planning Status plan in progress   Projected Discharge Date 02/04/17   CM will evaluate for rehabilitation potential yes   Patient choice offered to patient/family yes   Form for patient choice reviewed/signed and on chart yes   Facility or Agency Preferences [facility], [facility]   Patient aware of possible cost for ambulance transport?  Yes   Discharge Needs Assessment   Discharge Facility/Level Of Care Needs Acute Rehab Placement/Return (not psych)(code 20)   Transportation Available ambulance     MSW met with pt, wife and son at bedside, wife chose [facility] first choice and second [facility].  Son and wife are aware of out of pocket expense for mileage for ambulance transport, MSW explained that they may be able to set up payment plan with ambulance transportation depending on the company policy.  Referral placed to [facility], will continue to follow for d/c needs.    Discharge Plan:  Acute Rehab Placement/Return (not psych) (code 52)      The patient will continue to be evaluated for developing discharge needs.     Case Manager: Evalina Field  Phone: [phone]
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
## 2017-02-04 NOTE — Care Plan (Signed)
[place]  Occupational Therapy Progress Note    Patient Name: Terry Reid  Date of Birth: Feb 20, 1956  Height:  190.5 cm (6\' 3" )  Weight:  95 kg (209 lb 7 oz)  Room/Bed: 777/A  Payor: [employer] / Plan: [employer] / Product Type: Auto /     Assessment:    Pt with improved head control and RUE active movement today. He continues to require assist for sitting balance and follows commands inconsistenetly.  He is easily distracted and has difficultyy attending to task. Pt has difficulty tracking past midline to the R Would benefit from caontinued Acute rehab at d/c to further progress his level of function      Discharge Needs:   Equipment Recommendation: to be determined        Discharge Disposition:  inpatient rehabilitation facility     [place]   Based on current diagnosis, functional performance prior to admission, and current functional performance, this patient requires continued OT services in inpatient rehabilitation facility in order to achieve significant functional improvements.    Plan:   Continue to follow patient according to established plan of care.  The risks/benefits of therapy have been discussed with the patient/caregiver and he/she is in agreement with the established plan of care.     Subjective & Objective:        02/04/17 1347   Therapist Pager   OT Assigned/ Pager # Kennyth Lose [place]   Rehab Session   Document Type therapy note (daily note)   Total OT Minutes: 28   Patient Effort adequate   Symptoms Noted During/After Treatment fatigue   General Information   General Observations of Patient seen in rehab dept transferred onto mat table   Pre Treatment Status   Pre Treatment Patient Status Patient seen in Rehab department in bed   Support Present Pre Treatment  None   Mutuality/Individual Preferences   Mutuality Comment Maxi Move OOB   Pain Assessment   Pre/Post Treatment Pain Comment no c/o pain   Cognitive  Assessment/Interventions   Orientation Status  oriented to;person   Attention moderate impairment   Comment easily distracted difficulty attentding to tasks and remaining focused Requires cues to attend to the R   Vision Assessment/Interventions   Additional Documentation (difficulty tracking and attending to the R visualy)   RUE Assessment   RUE Other active movement in R shoulder and hand   Bed Mobility Assessment/Treatment   Supine-Sit Independence dependent (less than 25% patient effort);2 person assist required   Roll Left Independence  dependent (less than 25% patient effort);2 person assist required   Roll Right Independence  dependent (less than 25% patient effort);2 person assist required   Balance Skill Training   Sitting Balance: Static poor balance   Sitting, Dynamic (Balance) unable to balance   Therapeutic Exercise/Activity   Comment transferred mat table and brought up into sitting position. He requires Mod-MaxA posteriorly for sitting support. Pt worked on reaching tasks and sitting  balance. Attempt made for functional tasks with R hand   Occupational Therapy Clinical Impression   Functional Level at Time of Session Pt with improved head control and RUE active movement today. He continues to require assist for sitting balance and follows commands inconsistenetly.  He is easily distracted and has difficultyy attending to task. Pt has difficulty tracking past midline to the R Would benefit from caontinued Acute rehab at d/c to further progress his level of function  Anticipated Equipment Needs at Discharge to be determined   Anticipated Discharge Disposition inpatient rehabilitation facility       Therapist:   Melford Aase  Pager #: [phone]

## 2017-02-04 NOTE — Care Plan (Signed)
[place]  Physical Therapy Progress Note      Patient Name: Terry Reid  Date of Birth: 1956-07-04  Height:  190.5 cm (6\' 3" )  Weight:  95 kg (209 lb 7 oz)  Room/Bed: 777/A  Payor: [employer] / Plan: [employer] / Product Type: Auto /     Assessment:     Good tolerance to PT session this date. Tolerated sitting on edge of mat approx 15-20 min with max assist for sitting balance. Good head control this date (able to maintain independently). Pt easily distracted and difficulty with following commands. Will continue to follow pt and progress as tolerated.    Discharge Needs:   Equipment Recommendation: TBD    Discharge Disposition: inpatient rehabilitation facility    JUSTIFICATION OF DISCHARGE RECOMMENDATION   Based on current diagnosis, functional performance prior to admission, and current functional performance, this patient requires continued PT services in inpatient rehabilitation facility in order to achieve significant functional improvements in these deficit areas: arousal, attention, and cognition, aerobic capacity/endurance, gait, locomotion, and balance, ROM (range of motion), muscle performance.      Plan:   Continue to follow patient according to established plan of care.  The risks/benefits of therapy have been discussed with the patient/caregiver and he/she is in agreement with the established plan of care.     Subjective & Objective:        02/04/17 1348   Therapist Pager   PT Assigned/ Pager # ev [phone]   Rehab Session   Document Type therapy note (daily note)   Total PT Minutes: 28   Patient Effort adequate   Symptoms Noted During/After Treatment fatigue   General Information   Patient Profile Reviewed? yes   Patient/Family Observations Pt seen in therapy gym this date, supine in bed, RN approved session, seen with professional assist of OT   Medical Lines PIV Line   Respiratory Status room air   Existing Precautions/Restrictions fall precautions;full  code;weight bearing restriction   Weight-bearing Status   Extremity Weight-bearing Status left upper extremity;left lower extremity   Left Upper Extremity weight-bearing as tolerated (WBAT)   Left Lower Extremity weight-bearing as tolerated (WBAT)   Mutuality/Individual Preferences   Patient Specific Interventions OOB with maxi move   Pre Treatment Status   Pre Treatment Patient Status Patient seen in Rehab department in bed   Support Present Pre Treatment  None   Cognitive Assessment/Interventions   Behavior/Mood Observations alert;confused;cooperative   Orientation Status  oriented to;person   Comment easily distracted this date; difficulty with focusing and following commands   Pain Assessment   Pre/Post Treatment Pain Comment no c/o pain   Bed Mobility Assessment/Treatment   Safety Issues cognitive deficits limit understanding;decreased use of arms for pushing/pulling;decreased use of legs for bridging/pushing;impaired trunk control for bed mobility   Impairments balance impaired;cognition impaired;coordination impaired;endurance;flexibility decreased;postural control impaired;ROM decreased;strength decreased   Comment  lateral transfer from bed to mat table via maxi slide; once on mat table performed sidelying to sit with dep assist x 2; pt with much improved head control (able to hold head independently for the most part); required max assist for head control when fatigued; pt tolerated unsupported sitting on edge of mat approx 15-20 min with max assist for sitting balance; performed anterior/posterior weight shifting x 5 reps with cues for technique, however pt with no follow through   Roll Left Independence  verbal cues required;dependent (less than 25% patient effort);2 person assist required  Roll Right Independence  verbal cues required;dependent (less than 25% patient effort);2 person assist required   Scoot/Bridge Independence  dependent (less than 25% patient effort);2 person assist required    Sidelying to Sit, Independence verbal cues required;dependent (less than 25% patient effort);2 person assist required   Sit to Sidelying, Independence verbal cues required;dependent (less than 25% patient effort);2 person assist required   Balance Skill Training   Sitting Balance: Static poor balance   Sitting, Dynamic (Balance) unable to balance   Therapeutic Exercise/Activity   Comment AAROM completed to LLE; PROM completed to RLE       Orthotics/Misc Device    [employer]  (on RLE only)   Post Treatment Status   Post Treatment Patient Status Patient supine in bed;Returned to room via transport   Support Present Post Treatment  Other (See comments)  (transport present)   Plan of Care Review   Plan Of Care Reviewed With patient   Physical Therapy Clinical Impression   Assessment Good tolerance to PT session this date. Tolerated sitting on edge of mat approx 15-20 min with max assist for sitting balance. Good head control this date (able to maintain independently). Pt easily distracted and difficulty with following commands. Will continue to follow pt and progress as tolerated.   Anticipated Equipment Needs at Discharge (PT Clinical Impression) TBD   Anticipated Discharge Disposition  inpatient rehabilitation facility       Therapist:   Blanche [place], [place]   Pager #: [phone]
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
## 2017-02-05 MED ADMIN — sodium chloride 0.9 % intravenous solution: @ 06:00:00 | NDC 00338004904

## 2017-02-05 MED ADMIN — amino acids-protein hydrolysate 15 gram-60 kcal/30 mL oral liquid pack: ORAL | @ 10:00:00

## 2017-02-05 MED ADMIN — sodium chloride 0.9 % (flush) injection syringe: ORAL | @ 21:00:00

## 2017-02-05 NOTE — Nurses Notes (Addendum)
Patient is extremely upset that his wife is not at bedside. I spoke with Mrs. Levene earlier today and she verbalized she would be coming to see patient as soon as she could.   Patient adamant that we call his wife again and that he speaks to her.   Patient is currently on the phone with his wife and she is on her way to see patient.

## 2017-02-05 NOTE — Care Plan (Addendum)
Problem: Patient Care Overview (Adult,OB)  Goal: Plan of Care Review(Adult,OB)  The patient and/or their representative will communicate an understanding of their plan of care   Outcome: Ongoing (see interventions/notes)  Medical Nutrition Therapy Follow Up        SUBJECTIVE : Met with pt who was OOB in chair.  He reports he is feeling well today, denies any adverse GI symptoms.  Last BM was yesterday.  Pt has been eating better than last week.  Diet upgraded to Regular consistency food and liquids.  He claims to like the supplements ordered for him.    OBJECTIVE:     Current Diet Order/Nutrition Support:  MNT PROTOCOL FOR DIETITIAN  DIETARY ORAL SUPPLEMENTS Oral Supplements with tray: Magic Cup-Chocolate; BREAKFAST/LUNCH/DINNER/BEDTIME; 1 Can  DIETARY ORAL SUPPLEMENTS Oral Supplements with tray: Ensure Enlive-Chocolate; BREAKFAST/LUNCH/DINNER/BEDTIME; 1 Can  DIET REGULAR  ROOM SERVICE:  SEND AUTOMATIC HOUSE TRAY     Height Used for Calculations: 190.5 cm (6' 3" )  Weight Used For Calculations: 84.5 kg (186 lb 4.6 oz)  BMI (kg/m2): 23.33  BMI Assessment: BMI 18.5-24.9: normal        Estimated Needs:    Energy Calorie Requirements: 2350-2550 kcal/day (28-30 kcal/kg 84.5 BW) per day  Protein Requirements (gms/day): 110-127 grams per day (1.3-1.5 g/84.5 kg)  Fluid Requirements: 2350-2550 ml/day (28-30 ml/kg 84.5 BW) per day     Comments: Pt with R sided palsy     Recommend :   1.  Continue Regular Diet  2.  Provide assistance/encouragement with all meals  3.  Ensure Enlive TID, Magic Cup TID  4.  Continue oral prosource as tolerated, has not refused a dose recently  5.  Weekly weights  Will continue to follow    Nutrition Diagnosis: Inadequate protein-energy intake related to decreased mental status as evidenced by calorie count results indicating an average intake of 53% of estimated calorie needs and 47% of estimated protein needs.    Carmie Kanner, RDLD  02/05/2017, 09:54      Pager # [phone]
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
## 2017-02-05 NOTE — Care Plan (Signed)
[place]  Physical Therapy Progress Note      Patient Name: Terry Reid  Date of Birth: May 27, 1956  Height:  190.5 cm (6\' 3" )  Weight:  95 kg (209 lb 7 oz)  Room/Bed: 777/A  Payor: [employer] / Plan: [employer] / Product Type: Auto /     Assessment:     Good tolerance to PT treatment this date. Tolerated Moveo for LE strengthening and weight bearing. More movement in RUE this date (see OT note for details). Pt continues to progress towards goals. Continues to be an excellent candidate for rehab placement. Will follow.     Discharge Needs:   Equipment Recommendation: TBD    Discharge Disposition: inpatient rehabilitation facility    JUSTIFICATION OF DISCHARGE RECOMMENDATION   Based on current diagnosis, functional performance prior to admission, and current functional performance, this patient requires continued PT services in inpatient rehabilitation facility in order to achieve significant functional improvements in these deficit areas: arousal, attention, and cognition, aerobic capacity/endurance, gait, locomotion, and balance, ROM (range of motion), muscle performance.      Plan:   Continue to follow patient according to established plan of care.  The risks/benefits of therapy have been discussed with the patient/caregiver and he/she is in agreement with the established plan of care.     Subjective & Objective:        02/05/17 1353   Therapist Pager   PT Assigned/ Pager # ev [phone]   Rehab Session   Document Type therapy note (daily note)   Total PT Minutes: 39   Patient Effort good   Symptoms Noted During/After Treatment none   General Information   Patient Profile Reviewed? yes   Patient/Family Observations Pt seen in therapy gym this date, supine in bed, RN approved session, seen with professional assist of OT   Medical Lines PIV Line   Respiratory Status room air   Existing Precautions/Restrictions fall precautions;full code;weight bearing restriction    Weight-bearing Status   Extremity Weight-bearing Status left upper extremity;left lower extremity   Left Upper Extremity weight-bearing as tolerated (WBAT)   Left Lower Extremity weight-bearing as tolerated (WBAT)   Mutuality/Individual Preferences   Patient Specific Interventions OOB with maxi move   Pre Treatment Status   Pre Treatment Patient Status Patient seen in Rehab department in bed   Support Present Pre Treatment  Family present   Pain Assessment   Pre/Post Treatment Pain Comment no c/o pain   Bed Mobility Assessment/Treatment   Comment  lateral transfer to/from bed/Moveo with 3 person assist and maxi slide   Therapeutic Exercise/Activity   Comment Moveo for LE strengthening and weight bearing: tolerated approx 30 min in upright position; gradually increased incline to 30 degrees; no c/o dizziness/lightheadedness; able to performed 2 sets x 10 reps of squats with tactile cues for R knee flexion/extension; cues also provided for pt to look at R knee for visual feedback; rest breaks provided as needed d/t fatigue   Post Treatment Status   Post Treatment Patient Status Patient supine in bed;Returned to room via transport   Support Present Post Treatment  Family present;Other (See comments)  (transport present)   Plan of Care Review   Plan Of Care Reviewed With patient   Physical Therapy Clinical Impression   Assessment Good tolerance to PT treatment this date. Tolerated Moveo for LE strengthening and weight bearing. More movement in RUE this date (see OT note for details). Pt continues to progress towards  goals. Continues to be an excellent candidate for rehab placement. Will follow.    Anticipated Equipment Needs at Discharge (PT Clinical Impression) TBD   Anticipated Discharge Disposition  inpatient rehabilitation facility       Therapist:   Blanche East, [place]   Pager #: [phone]
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
## 2017-02-05 NOTE — Care Plan (Signed)
[place]  Occupational Therapy Progress Note    Patient Name: Terry Reid  Date of Birth: 01-22-1956  Height:  190.5 cm (6\' 3" )  Weight:  95 kg (209 lb 7 oz)  Room/Bed: 777/A  Payor: [employer] / Plan: [employer] / Product Type: Auto /     Assessment:    Pt continues to progress with increased active movement in R side as well as slightly improved cognitive status today. He was able to complete functional tasks with [facility] assist with use of R hand. Would benefit from TBI rehab at d/c to further progress cognitive level and continued working on [place]      Discharge Needs:   Equipment Recommendation: to be determined        Discharge Disposition:  inpatient rehabilitation facility     [place]   Based on current diagnosis, functional performance prior to admission, and current functional performance, this patient requires continued OT services in inpatient rehabilitation facility in order to achieve significant functional improvements.    Plan:   Continue to follow patient according to established plan of care.  The risks/benefits of therapy have been discussed with the patient/caregiver and he/she is in agreement with the established plan of care.     Subjective & Objective:        02/05/17 1354   Therapist Pager   OT Assigned/ Pager # Kennyth Lose [place]   Rehab Session   Document Type therapy note (daily note)   Total OT Minutes: 41   Patient Effort good   General Information   Medical Lines PIV Line   Respiratory Status room air   Existing Precautions/Restrictions fall precautions   Pre Treatment Status   Pre Treatment Patient Status Patient seen in Rehab department in bed   Support Present Pre Treatment  Family present   Mutuality/Individual Preferences   Patient Specific Interventions Maxi Move OOB   Pain Assessment   Pre/Post Treatment Pain Comment no reports of apin   Cognitive Assessment/Interventions   Orientation Status   disoriented to;person;time   Attention mild impairment;moderate impairment;needs re-direction   Comment follows commands inconsistently requires cues and time for processing. Improved from session yesterday   Bed Mobility Assessment/Treatment   Comment  lateral transfer to [facility]   Therapeutic Exercise/Activity   Comment worked on [place] and RLE active movement as well as sels care tasks while up on Moveo. Pt requires HOH assist with oral care. He did have increased active digit extension today.    Post Treatment Status   Post Treatment Patient Status Patient supine in bed;Returned to room via transport   Support Present Post Treatment  Family present   Occupational Therapy Clinical Impression   Functional Level at Time of Session Pt continues to progress with increased active movement in R side as well as slightly improved cognitive status today. He was able to complete functional tasks with [facility] assist with use of R hand. Would benefit from TBI rehab at d/c to further progress cognitive level and continued working on [place] Needs at Discharge to be determined   Anticipated Discharge Disposition inpatient rehabilitation facility       Therapist:   Melford Aase  Pager #: [phone]

## 2017-02-05 NOTE — Nurses Notes (Signed)
Patient to PT gym x2 transport. Wife at bedside.

## 2017-02-05 NOTE — Nurses Notes (Addendum)
Patient has not voided since 1000. Bladder scanned for 570ml. Text page sent to [facility].   Straight cath 600 ml.

## 2017-02-06 LAB — CBC WITH DIFF
HCT: 41.5 % (ref 36.7–47.0)
HGB: 14.1 g/dL (ref 12.5–16.3)
HGB: 14.1 g/dL (ref 12.5–16.3)
MCH: 31 pg (ref 27.4–33.0)
MCH: 31 pg (ref 27.4–33.0)
MCHC: 34.1 g/dL (ref 32.5–35.8)
MCV: 90.9 fL (ref 78.0–100.0)
MPV: 7.7 fL (ref 7.5–11.5)
PLATELETS: 521 x10ˆ3/uL — ABNORMAL HIGH (ref 140–450)
RBC: 4.57 x10ˆ6/uL (ref 4.06–5.63)
RDW: 12.6 % (ref 12.0–15.0)
WBC: 14.8 x10ˆ3/uL — ABNORMAL HIGH (ref 3.5–11.0)

## 2017-02-06 LAB — BASIC METABOLIC PANEL
ANION GAP: 10 mmol/L (ref 4–13)
BUN/CREA RATIO: 45 — ABNORMAL HIGH (ref 6–22)
BUN: 36 mg/dL — ABNORMAL HIGH (ref 8–25)
CALCIUM: 9.8 mg/dL (ref 8.5–10.2)
CHLORIDE: 109 mmol/L (ref 96–111)
CO2 TOTAL: 22 mmol/L (ref 22–32)
CREATININE: 0.8 mg/dL (ref 0.62–1.27)
ESTIMATED GFR: >59 mL/min/1.73mˆ2 (ref >59)
GLUCOSE: 111 mg/dL (ref 65–139)
POTASSIUM: 3.8 mmol/L (ref 3.5–5.1)
SODIUM: 141 mmol/L (ref 136–145)

## 2017-02-06 LAB — MANUAL DIFFERENTIAL (CELLAVISION)
BANDS: 2 % (ref 0–15)
BASOPHIL #: 0.15 x10ˆ3/uL (ref 0.00–0.20)
BASOPHIL %: 1 %
EOSINOPHIL #: 0.73 x10ˆ3/uL — ABNORMAL HIGH (ref 0.00–0.50)
EOSINOPHIL %: 5 %
LYMPHOCYTE #: 2.63 x10?3/uL (ref 1.00–4.80)
LYMPHOCYTE #: 2.63 x10ˆ3/uL (ref 1.00–4.80)
LYMPHOCYTE %: 18 %
METAMYELOCYTES: 1 % (ref 0–2)
MONOCYTE #: 0.74 10*3/uL (ref 0.30–1.00)
MONOCYTE #: 0.74 x10ˆ3/uL (ref 0.30–1.00)
MONOCYTE %: 5 %
MONOCYTE %: 5 %
NEUTROPHIL #: 10.4 x10ˆ3/uL — ABNORMAL HIGH (ref 1.50–7.70)
NEUTROPHIL %: 68 %

## 2017-02-06 NOTE — Care Plan (Signed)
[place]  Occupational Therapy Progress Note    Patient Name: Terry Reid  Date of Birth: 1956-09-11  Height:  190.5 cm (6\' 3" )  Weight:  95 kg (209 lb 7 oz)  Room/Bed: 854/A  Payor: [employer] / Plan: [employer] / Product Type: Auto /     Assessment:    Pt with good therapy session today. He ahd improved movement in RUE and hand. He was able to assist with some slef care tasks. Completes sit to stand x4 on Denna Haggard and requires cues and physical cues to correct midline sittin. Acute rehab at d/c to maximize his level of independence      Discharge Needs:   Equipment Recommendation: to be determined      Discharge Disposition:  inpatient rehabilitation facility     JUSTIFICATION OF DISCHARGE RECOMMENDATION   Based on current diagnosis, functional performance prior to admission, and current functional performance, this patient requires continued OT services in inpatient rehabilitation facility in order to achieve significant functional improvements.    Plan:   Continue to follow patient according to established plan of care.  The risks/benefits of therapy have been discussed with the patient/caregiver and he/she is in agreement with the established plan of care.     Subjective & Objective:        02/06/17 1127   Therapist Pager   OT Assigned/ Pager # [phone] Lose [phone]   Rehab Session   Document Type therapy note (daily note)   Total OT Minutes: 39   Patient Effort good   Symptoms Noted During/After Treatment fatigue   Pre Treatment Status   Pre Treatment Patient Status Patient seen in Rehab department in bed   Support Present Pre Treatment  None   Pain Assessment   Pre/Post Treatment Pain Comment no c/o pain   Bed Mobility Assessment/Treatment   Bed Mobility, Assistive Device Head of Bed Elevated   Supine-Sit Independence moderate assist (50% patient effort);2 person assist required   Comment  pt requires ModA for sitting balance as he leans to the R   Sidelying to  Sit, [place] moderate assist (50% patient effort);2 person assist required   Sit to Sidelying, Independence moderate assist (50% patient effort);2 person assist required   Transfer Assessment/Treatment   Sit-Stand Independence  minimum assist (75% patient effort);moderate assist (50% patient effort);2 person assist required;verbal cues required   Stand-Sit Independence  minimum assist (75% patient effort);moderate assist (50% patient effort);2 person assist required;verbal cues required   Sit-Stand-Sit, Assist Device Denna Haggard   Lower Body Dressing Assessment/Training   Comment ModA to don pants sitting EOB   Balance Skill Training   Sitting Balance: Static fair - balance   Sitting, Dynamic (Balance) fair - balance   Sit-to-Stand Balance poor + balance   Standing Balance: Static poor + balance   Standing Balance: Dynamic poor balance   Therapeutic Exercise/Activity   Comment Worked on sit to stand from w/c and midline sitting. Pt requires MiA and cues to self correct sitting posture and midline sittingAlso worked on sit ot stand on Denna Haggard with assist x2 and stood x4   Post Treatment Status   Post Treatment Patient Status Patient sitting in bedside chair or w/c   Support Present Post Treatment  None   Occupational Therapy Clinical Impression   Functional Level at Time of Session Pt with good therapy session today. He had improved movement in RUE and hand. He was able to assist with some  self care tasks. Completes sit to stand x4 on Denna Haggard and requires cues and physical cues to correct midline sitting. Acute rehab at d/c to maximize his level of independence   Anticipated Equipment Needs at Discharge to be determined   Anticipated Discharge Disposition inpatient rehabilitation facility       Therapist:   Melford Aase  Pager #: [phone]

## 2017-02-06 NOTE — Nurses Notes (Signed)
Pt left floor to go to the gym.

## 2017-02-06 NOTE — Care Plan (Signed)
[place]  Physical Therapy Progress Note      Patient Name: Terry Reid  Date of Birth: October 13, 1955  Height:  190.5 cm (6\' 3" )  Weight:  95 kg (209 lb 7 oz)  Room/Bed: 854/A  Payor: [employer] / Plan: [employer] / Product Type: Auto /     Assessment:     Good tolerance to PT treatment this date. Pt tolerated standing with use of Denna Haggard and 2 person assist. Presented with heavy lean to the right and required increased assist to maintain midline. Good weight bearing through BLEs. Motivated to participate. Will continue to follow pt and progress as tolerated.    Discharge Needs:   Equipment Recommendation: TBD     Discharge Disposition: inpatient rehabilitation facility    JUSTIFICATION OF DISCHARGE RECOMMENDATION   Based on current diagnosis, functional performance prior to admission, and current functional performance, this patient requires continued PT services in inpatient rehabilitation facility in order to achieve significant functional improvements in these deficit areas: arousal, attention, and cognition, aerobic capacity/endurance, gait, locomotion, and balance, ROM (range of motion), muscle performance.      Plan:   Continue to follow patient according to established plan of care.  The risks/benefits of therapy have been discussed with the patient/caregiver and he/she is in agreement with the established plan of care.     Subjective & Objective:        02/06/17 1128   Therapist Pager   PT Assigned/ Pager # ev [phone]   Rehab Session   Document Type therapy note (daily note)   Total PT Minutes: 39   Patient Effort good   Symptoms Noted During/After Treatment fatigue   General Information   Patient Profile Reviewed? yes   Patient/Family Observations Pt seen in therapy gym this date, supine in bed, RN approved session, seen with professional assist of OT   Medical Lines PIV Line   Respiratory Status room air   Existing Precautions/Restrictions fall  precautions;full code;weight bearing restriction   Weight-bearing Status   Extremity Weight-bearing Status left upper extremity;left lower extremity   Left Upper Extremity weight-bearing as tolerated (WBAT)   Left Lower Extremity weight-bearing as tolerated (WBAT)   Mutuality/Individual Preferences   Patient Specific Interventions OOB with maxi move   Pre Treatment Status   Pre Treatment Patient Status Patient seen in Rehab department in bed   Support Present Pre Treatment  None   Pain Assessment   Pre/Post Treatment Pain Comment no c/o pain   Bed Mobility Assessment/Treatment   Bed Mobility, Assistive Device bed rails;Head of Bed Elevated   Safety Issues cognitive deficits limit understanding;decreased use of arms for pushing/pulling;decreased use of legs for bridging/pushing;impaired trunk control for bed mobility   Impairments balance impaired;cognition impaired;coordination impaired;endurance;flexibility decreased;pain;ROM decreased;strength decreased   Comment  tolerated static sitting on EOB approx 10 min with mod assist for sitting balance; pt presents with heavy lean to the left; unable to maintain midline   Sidelying to Sit, Independence verbal cues required;moderate assist (50% patient effort);2 person assist required   Sit to Sidelying, [place] verbal cues required;moderate assist (50% patient effort);2 person assist required   Transfer Assessment/Treatment   Sit-Stand Independence  verbal cues required;moderate assist (50% patient effort);maximum assist (25% patient effort);2 person assist required   Stand-Sit Independence  verbal cues required;moderate assist (50% patient effort);maximum assist (25% patient effort);2 person assist required   Sit-Stand-Sit, Assist Device Denna Haggard   Transfer Safety Issues weight-shifting ability decreased  Transfer Impairments balance impaired;cognition impaired;coordination impaired;endurance;flexibility decreased;postural control impaired;ROM decreased;strength  decreased   Transfer Comment performed x 4 trials to increase activity tolerance; presents with heavy lean to the right and requires max assist x 2 to maintain midline; tolerated standing approx 15-20 sec each trial   Gait Assessment/Treatment   Comment not appropriate at this time   Balance Skill Training   Sitting Balance: Static fair - balance   Sitting, Dynamic (Balance) fair - balance   Sit-to-Stand Balance poor + balance   Standing Balance: Static poor + balance   Standing Balance: Dynamic unable to balance   Systems Impairment Contributing to Balance Disturbance musculoskeletal   Identified Impairments Contributing to Balance Disturbance impaired coordination;impaired postural control;decreased strength   Therapeutic Exercise/Activity   Comment PROM to RLE   Post Treatment Status   Post Treatment Patient Status Patient supine in bed;Returned to room via transport   Support Present Post Treatment  Other (See comments)  (transport present)   Physical Therapy Clinical Impression   Assessment Good tolerance to PT treatment this date. Pt tolerated standing with use of Denna Haggard and 2 person assist. Presented with heavy lean to the right and required increased assist to maintain midline. Good weight bearing through BLEs. Motivated to participate. Will continue to follow pt and progress as tolerated.   Anticipated Equipment Needs at Discharge (PT Clinical Impression) TBD   Anticipated Discharge Disposition  inpatient rehabilitation facility       Therapist:   Blanche East, [place]   Pager #: [phone]

## 2017-02-06 NOTE — Nurses Notes (Signed)
Paged service, bladder scan performed showing 357mL. Waiting for straight cath order.
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
## 2017-02-06 NOTE — Care Plan (Signed)
Problem: Patient Care Overview (Adult,OB)  Goal: Plan of Care Review(Adult,OB)  The patient and/or their representative will communicate an understanding of their plan of care   Outcome: Ongoing (see interventions/notes)  Pt alert to himself and wife, disoriented to situation, time, and place. Pt on sitter select. Pt went to PT gym for rehab this morning. Pt turned Q2 and had percussion and vibration Q2 performed throughout the day.Neuro checks performed Q4. Complains of no pain. Wife at bedside, attentive to pt.   Goal: Individualization/Patient Specific Goal(Adult/OB)  Outcome: Ongoing (see interventions/notes)      Problem: Fall Risk (Adult)  Goal: Absence of Falls  Patient will demonstrate the desired outcomes by discharge/transition of care.   Outcome: Ongoing (see interventions/notes)      Problem: Acute Rehab Services Goal & Intervention Plan  Goal: Cognition Goal  Stand Alone Therapy Goal   Outcome: Ongoing (see interventions/notes)      Problem: Fracture Orthopaedic (Adult)  Prevent and manage potential problems including:  1. bleeding  2. delayed union/nonunion  3. embolism  4. functional deficit/self-care deficit  5. gastrointestinal complications  6. infection  7. neurovascular impairment/injury  8. pain  9. respiratory compromise  10. situational response  11. skin integrity impairment  12. urinary retention   Goal: Signs and Symptoms of Listed Potential Problems Will be Absent, Minimized or Managed (Fracture Orthopaedic)  Signs and symptoms of listed potential problems will be absent, minimized or managed by discharge/transition of care (reference Fracture Orthopaedic (Adult) CPG).   Outcome: Ongoing (see interventions/notes)

## 2017-02-06 NOTE — Nurses Notes (Signed)
Pt returned to floor
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
## 2017-02-07 MED ORDER — MELATONIN 3 MG TABLET
3.00 mg | ORAL_TABLET | Freq: Every evening | ORAL | Status: DC
Start: 2017-02-07 — End: 2017-02-12
  Administered 2017-02-07 – 2017-02-11 (×5): 3 mg via ORAL
  Filled 2017-02-07 (×5): qty 1

## 2017-02-07 MED ADMIN — heparin, porcine (PF) 100 unit/mL intravenous syringe: ORAL | @ 21:00:00

## 2017-02-07 NOTE — Care Plan (Addendum)
Problem: Patient Care Overview (Adult,OB)  Goal: Plan of Care Review(Adult,OB)  The patient and/or their representative will communicate an understanding of their plan of care   Outcome: Ongoing (see interventions/notes)    Medical Nutrition Therapy    I/P:  Calorie count to begin today and run through Sunday 5/6.  Please record intake on provided form.    Carmie Kanner, RDLD  02/07/2017, 09:37
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
## 2017-02-07 NOTE — Nurses Notes (Signed)
Pt last voided at 1500 via straight cath. Pt bladder scanned for 219ml. Pt denies any urge to urinate. Pt is sleeping at this time. Will continue to monitor. Text paged service.

## 2017-02-07 NOTE — Care Plan (Signed)
[place]  Occupational Therapy Progress Note    Patient Name: Terry Reid  Date of Birth: 07-Jun-1956  Height:  190.5 cm (6\' 3" )  Weight:  95 kg (209 lb 7 oz)  Room/Bed: 854/A  Payor: [employer] / Plan: [employer] / Product Type: Auto /     Assessment:    (P) Patient tolerated OT session well. Patient was able to demonstrate grooming tasks (Facial, Oral, Hair) Patient continues to require assistance for all mobility and is currently appropriate for acute rehab upon DC       Discharge Needs:   Equipment Recommendation: to be determined    .    Discharge Disposition:  inpatient rehabilitation facility     JUSTIFICATION OF DISCHARGE RECOMMENDATION   Based on current diagnosis, functional performance prior to admission, and current functional performance, this patient requires continued OT services in inpatient rehabilitation facility in order to achieve significant functional improvements.    Plan:   Continue to follow patient according to established plan of care.  The risks/benefits of therapy have been discussed with the patient/caregiver and he/she is in agreement with the established plan of care.     Subjective & Objective:        02/07/17 1156   Therapist Pager   OT Assigned/ Pager # [phone]   Rehab Session   Document Type therapy note (daily note)   Total OT Minutes: 45   Patient Effort good   Symptoms Noted During/After Treatment none   General Information   Patient Profile Reviewed? yes   Respiratory Status room air   Existing Precautions/Restrictions fall precautions;full code;weight bearing restriction   Pre Treatment Status   Pre Treatment Patient Status Patient seen in Rehab department in bed   Mutuality/Individual Preferences   Patient Specific Interventions OOB with maxi move   Mutuality Comment Patient and RN agreeable to OT interventionm, seen with professional assist of PT    Vital Signs   Vitals Comment VSS   Pain Assessment   Pre/Post Treatment  Pain Comment Patient did not c/o pain    Coping/Psychosocial   Observed Emotional State calm;cooperative   Coping/Psychosocial Response Interventions   Plan Of Care Reviewed With patient   Supportive Measures active listening utilized;positive reinforcement provided   Coping/Psychosocial Interventions   Supportive Measures active listening utilized;positive reinforcement provided   Cognitive Assessment/Interventions   Behavior/Mood Observations cooperative;confused   Follows Commands  follows one step commands;delayed response/completion;increased processing time needed   Mobility Assessment/Training   Extremity Weight-bearing Status left upper extremity;left lower extremity   Left Upper Extremity weight-bearing as tolerated (WBAT)   Left Lower Extremity weight-bearing as tolerated (WBAT)   Bed Mobility Assessment/Treatment   Bed Mobility, Assistive Device draw sheet;Head of Bed Elevated   Supine-Sit Independence verbal cues required;moderate assist (50% patient effort);maximum assist (25% patient effort);2 person assist required   Sit to Supine, Independence verbal cues required;maximum assist (25% patient effort);2 person assist required   Safety Issues decreased use of arms for pushing/pulling;decreased use of legs for bridging/pushing   Impairments balance impaired;cognition impaired;coordination impaired;endurance;flexibility decreased;postural control impaired;strength decreased   Transfer Assessment/Treatment   Sit-Stand Independence  verbal cues required;moderate assist (50% patient effort);maximum assist (25% patient effort);2 person assist required   Stand-Sit Independence  verbal cues required;moderate assist (50% patient effort);maximum assist (25% patient effort);2 person assist required   Sit-Stand-Sit, Assist Device Denna Haggard   Transfer Safety Issues weight-shifting ability decreased   Transfer Impairments balance impaired;coordination impaired;endurance;flexibility decreased;postural control  impaired;strength decreased   Grooming Assessment/Training   Position sitting;standing  (Patient seated in sara stedy )   Independence Level moderate assist (50% patient effort)  (SBA with  L hand, MOD required for R hand )   Impairments motor control impaired;muscle tone abnormal   Comment Completed facial, oral, and hair grooming    Balance Skill Training   Sitting Balance: Static fair - balance   Sitting, Dynamic (Balance) fair - balance   Sit-to-Stand Balance poor + balance   Standing Balance: Static poor + balance   Standing Balance: Dynamic poor balance   Systems Impairment Contributing to Balance Disturbance cognitive;neuromuscular;musculoskeletal   Post Treatment Status   Post Treatment Patient Status Patient supine in bed;Returned to room via transport   Occupational Therapy Clinical Impression   Functional Level at Time of Session Patient tolerated OT session well. Patient was able to demonstrate grooming tasks (Facial, Oral, Hair) Patient continues to require assistance for all mobility and is currently appropriate for acute rehab upon DC        Therapist:   Haig Prophet  Pager #: [phone]

## 2017-02-07 NOTE — Care Management Notes (Signed)
[facility]  Care Management Note    Patient Name: Terry Reid  Date of Birth: 25-Sep-1956  Sex: male  Date/Time of Admission: 01/24/2017 10:52 AM  Room/Bed: 854/A  Payor: [employer] / Plan: [employer] / Product Type: Auto /    LOS: 14 days   PCP: Dagoberto Reef, MD    Admitting Diagnosis:  Trauma [T14.90XA]    Assessment:      02/07/17 1618   Assessment Details   Assessment Type Continued Assessment   Date of Care Management Update 02/07/17   Date of Next DCP Update 02/10/17   Care Management Plan   Discharge Planning Status plan in progress   Projected Discharge Date 02/10/17   CM will evaluate for rehabilitation potential yes   Patient choice offered to patient/family yes   Form for patient choice reviewed/signed and on chart yes   Patient aware of possible cost for ambulance transport?  Yes   Discharge Needs Assessment   Discharge Facility/Level Of Care Needs Acute Rehab Placement/Return (not psych)(code 62)   Transportation Available ambulance     Per service, pt d/c ready for rehab facility.  [facility] liaison visited pt and wife today at bedside.  Referral is still being reviewed for disposition for [facility] at this time.  Will continue to follow.    Discharge Plan:  Acute Rehab Placement/Return (not psych) (code 48)      The patient will continue to be evaluated for developing discharge needs.     Case Manager: Evalina Field  Phone: [phone]

## 2017-02-07 NOTE — Respiratory Therapy (Signed)
02/07/17 0945       Respiratory Parameters   Start Time 0945   1st Attempt 2.02 Liters   2nd Attempt 3.1 Liters   3rd Attempt 2.08 Liters   Average FVC 2.4 Liters   1st Attempt -40 cmH2O   2nd Attempt -40 cmH2O   3rd Attempt -30 cmH2O   Average NIF -37 cmH2O   Respiratory Effort Good   $Parameters (Resp only) Completed   Stop Time 0948   Duration 3 Minutes

## 2017-02-07 NOTE — Care Plan (Signed)
[place]  Speech Therapy Initial Speech/Language Evaluation    Patient Name: Terry Reid  Date of Birth: 02-25-1956  Height: Height: 190.5 cm (6\' 3" )  Weight: Weight: 95 kg (209 lb 7 oz)  Room/Bed: 854/A  Payor: [employer] / Plan: [employer] / Product Type: Auto /     Assessment:  Functional Level At Time Of Evaluation: Receptive deficits noted during the following tasks: 1-step commands, yes/no questions, objecct ID and picture ID. Expressive deficits noted during the following tasks: orientation, automatic utterance, confrontation naming, responsive naming, and object/function description.   SLP Diagnosis: Pt presents with moderate receptive deficits and moderate to severe expressive deficits. 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 Perseveration noted during picture and object naming tasks.      Discharge Needs:  Discharge Disposition: inpatient rehabilitation facility (Will need justification of DC recommendation if placement is required.)    JUSTIFICATION OF DISCHARGE RECOMMENDATION  Based on current diagnosis, functional performance prior to admission, and current functional performance, this patient requires continued SLP services in inpatient rehabilitation facility in order to achieve significant functional improvements for Speech and Language.    Plan:  To provide Speech Therapy services 2-3 times/wk for duration of Recommend continued speech therapy during this admission and following discharge in the inpatient setting. .    The risks and benefits of therapy have been discussed with the patient/caregiver and he/she is in agreement with the established plan of care.    Subjective & Objective:     02/07/17 1549   Therapist Pager   SLP Pager Sonia Baller [phone]    Rehab Session   Document Type evaluation;other (see comments)  (Speech Evaluation )   Total SLP Minutes: 35   Patient Effort excellent   Cognitive  Assessment/Interventions   Behavior/Mood Observations alert   Orientation Status  disoriented to;place;situation;time;verbal cues/prompts needed for orientation;other (see comments)  (expressive deficits )   Attention distractible;difficulty attending to task/directions;moderate impairment;needs re-direction;difficult dividing attention;other (see comments)  (tangential speech)   Follows Commands  follows one step commands   Communication Assessment/Intervention   Additional Documentation Auditory Comprehend Assess/Intervention (Group);Verbal Expression Assess/Intervention (Group)   Auditory Comprehend Assess/Intervention   Auditory Comprehension (Communication) moderate impairment   Able to Identify Objects (Communication) mild impairment;field of 2   Identify Objects Comment Pt achieved 60% accuracy identifying objects in field of 2.    Able to Identify Pictures (Communication) mild impairment;field of 2   Identify Pictures Comment Pt achieved 90% accuracy identifying pictures in field of 2.    Answers Yes/No Questions (Communication) moderate impairment   Yes/No Questions Comment Pt achieved 60% accuracy responding to simple yes/no questions and 40% accuracy responding to complex yes/no questions.    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    Successful Verbal Strategies (Communication) phonemic cues;sentence completion   Initiation of Phonation (Communication) WFL   SLP Clinical  Impression   SLP Diagnosis Pt presents with moderate receptive deficits and moderate to severe expressive deficits. Speech is tangential; pt often preaches in response to  questions/tasks as he preaches for a living. Perseveration noted during picture and object naming tasks.     Prognosis Good    Functional Level At Time Of Evaluation Receptive deficits noted during the following tasks: 1-step commands, yes/no questions, object ID and picture ID. Expressive deficits noted during the following tasks: orientation, automatic utterance, confrontation naming, responsive naming, and object/function description.    Rehab Potential good, to achieve stated therapy goals   Therapy Frequency (PT Clinical Impression) 2-3 times/wk   Predicted Dur of Therapy Intervention Recommend continued speech therapy during this admission and following discharge in the inpatient setting.    Expected Dur of Therapy Session (min) 30   Anticipated Discharge Disposition inpatient rehabilitation facility       Therapist:  Knox Royalty, SLP   Pager #: [phone]  Phone #: [phone]

## 2017-02-07 NOTE — Care Plan (Signed)
[facility]  Physical Therapy Progress Note      Patient Name: Terry Reid  Date of Birth: 06-Sep-1956  Height:  190.5 cm (6\' 3" )  Weight:  95 kg (209 lb 7 oz)  Room/Bed: 854/A  Payor: [employer] / Plan: [employer] / Product Type: Auto /     Assessment:     Good tolerance to PT treatment this date. Tolerated standing with use of Denna Haggard and 2 person assist. Continues to present with lateral lean to the right with sitting and standing. No active movement in RLE. Pt continues to be an excellent candidate for rehab placement. Will follow.     Discharge Needs:   Equipment Recommendation: TBD    Discharge Disposition: inpatient rehabilitation facility    JUSTIFICATION OF DISCHARGE RECOMMENDATION   Based on current diagnosis, functional performance prior to admission, and current functional performance, this patient requires continued PT services in inpatient rehabilitation facility in order to achieve significant functional improvements in these deficit areas: arousal, attention, and cognition, aerobic capacity/endurance, gait, locomotion, and balance, ROM (range of motion), muscle performance.      Plan:   Continue to follow patient according to established plan of care.  The risks/benefits of therapy have been discussed with the patient/caregiver and he/she is in agreement with the established plan of care.     Subjective & Objective:        02/07/17 1155   Therapist Pager   PT Assigned/ Pager # ev [phone]   Rehab Session   Document Type therapy note (daily note)   Total PT Minutes: 45   Patient Effort good   Symptoms Noted During/After Treatment none   General Information   Patient Profile Reviewed? yes   Patient/Family Observations Pt seen in therapy gym this date, supine in bed, RN approved session, seen with professional assist of OT   Respiratory Status room air   Existing Precautions/Restrictions fall precautions;full code;weight bearing restriction   Weight-bearing  Status   Extremity Weight-bearing Status left upper extremity;left lower extremity   Left Upper Extremity weight-bearing as tolerated (WBAT)   Left Lower Extremity weight-bearing as tolerated (WBAT)   Mutuality/Individual Preferences   Patient Specific Interventions OOB with maxi move   Pre Treatment Status   Pre Treatment Patient Status Patient seen in Rehab department in bed   Support Present Pre Treatment  None   Pain Assessment   Pre/Post Treatment Pain Comment no c/o pain   Bed Mobility Assessment/Treatment   Bed Mobility, Assistive Device draw sheet;Head of Bed Elevated   Supine-Sit Independence verbal cues required;moderate assist (50% patient effort);maximum assist (25% patient effort);2 person assist required   Sit to Supine, Independence verbal cues required;maximum assist (25% patient effort);2 person assist required   Safety Issues decreased use of arms for pushing/pulling;decreased use of legs for bridging/pushing   Impairments balance impaired;cognition impaired;coordination impaired;endurance;flexibility decreased;postural control impaired;strength decreased   Comment  verbal cues for log roll technique; assist provided for upper trunk and BLEs; increased posterior lean in sitting; required max assist for sitting balance; static sitting on EOB approx 10 min with ranging assist of max assist to min assist with max cues for anterior weight shifting   Transfer Assessment/Treatment   Sit-Stand Independence  verbal cues required;moderate assist (50% patient effort);maximum assist (25% patient effort);2 person assist required   Stand-Sit Independence  verbal cues required;moderate assist (50% patient effort);maximum assist (25% patient effort);2 person assist required   Sit-Stand-Sit, Assist Device Denna Haggard  Transfer Safety Issues weight-shifting ability decreased   Transfer Impairments balance impaired;coordination impaired;endurance;flexibility decreased;postural control impaired;strength decreased      Transfer Comment cues for hand placement on pull up bar prior to standing/sitting; manual assist provided to place RUE on pull up bar - pt able to grasp well with R hand; performed 3 standing trials to increase activity tolerance; increased lateral lean to the right; max assist provided to come to midline   Gait Assessment/Treatment   Comment not appropriate at this time   Balance Skill Training   Sitting Balance: Static fair - balance   Sitting, Dynamic (Balance) fair - balance   Sit-to-Stand Balance poor + balance   Standing Balance: Static poor + balance   Standing Balance: Dynamic poor balance   Systems Impairment Contributing to Balance Disturbance cognitive;neuromuscular;musculoskeletal   Identified Impairments Contributing to Balance Disturbance impaired coordination;impaired postural control;decreased strength   Therapeutic Exercise/Activity   Comment PROM RLE; AAROM LLE - x 10 reps each   Post Treatment Status   Post Treatment Patient Status Patient supine in bed;Returned to room via transport   Support Present Post Treatment  Other (See comments)  (transport present)   Plan of Care Review   Plan Of Care Reviewed With patient   Physical Therapy Clinical Impression   Assessment Good tolerance to PT treatment this date. Tolerated standing with use of Denna Haggard and 2 person assist. Continues to present with lateral lean to the right with sitting and standing. No active movement in RLE. Pt continues to be an excellent candidate for rehab placement. Will follow.    Anticipated Equipment Needs at Discharge (PT Clinical Impression) TBD   Anticipated Discharge Disposition  inpatient rehabilitation facility       Therapist:   Blanche East, [place]   Pager #: [phone]

## 2017-02-08 LAB — CBC WITH DIFF
BASOPHIL #: 0.15 x10ˆ3/uL (ref 0.00–0.20)
BASOPHIL %: 1 %
EOSINOPHIL #: 0.2 x10ˆ3/uL (ref 0.00–0.50)
EOSINOPHIL %: 2 %
HCT: 43.2 % (ref 36.7–47.0)
HGB: 14.6 g/dL (ref 12.5–16.3)
LYMPHOCYTE #: 2.71 x10ˆ3/uL (ref 1.00–4.80)
LYMPHOCYTE %: 20 %
MCH: 31.1 pg (ref 27.4–33.0)
MCHC: 33.9 g/dL (ref 32.5–35.8)
MCV: 91.7 fL (ref 78.0–100.0)
MCV: 91.7 fL (ref 78.0–100.0)
MONOCYTE #: 1.19 x10?3/uL — ABNORMAL HIGH (ref 0.30–1.00)
MONOCYTE #: 1.19 x10ˆ3/uL — ABNORMAL HIGH (ref 0.30–1.00)
MONOCYTE %: 9 %
MPV: 7.7 fL (ref 7.5–11.5)
NEUTROPHIL #: 9.48 10*3/uL — ABNORMAL HIGH (ref 1.50–7.70)
NEUTROPHIL #: 9.48 x10ˆ3/uL — ABNORMAL HIGH (ref 1.50–7.70)
NEUTROPHIL %: 69 %
PLATELETS: 622 x10ˆ3/uL — ABNORMAL HIGH (ref 140–450)
RBC: 4.71 x10ˆ6/uL (ref 4.06–5.63)
RDW: 12.7 % (ref 12.0–15.0)
WBC: 13.7 x10ˆ3/uL — ABNORMAL HIGH (ref 3.5–11.0)

## 2017-02-08 LAB — BASIC METABOLIC PANEL
ANION GAP: 9 mmol/L (ref 4–13)
BUN/CREA RATIO: 61 — ABNORMAL HIGH (ref 6–22)
BUN: 45 mg/dL — ABNORMAL HIGH (ref 8–25)
CALCIUM: 9.7 mg/dL (ref 8.5–10.2)
CHLORIDE: 109 mmol/L (ref 96–111)
CO2 TOTAL: 23 mmol/L (ref 22–32)
CREATININE: 0.74 mg/dL (ref 0.62–1.27)
ESTIMATED GFR: >59 mL/min/1.73mˆ2 (ref >59)
GLUCOSE: 99 mg/dL (ref 65–139)
POTASSIUM: 3.8 mmol/L (ref 3.5–5.1)
SODIUM: 141 mmol/L (ref 136–145)

## 2017-02-08 MED ADMIN — acetaminophen 325 mg tablet: ORAL | @ 21:00:00

## 2017-02-08 MED ADMIN — zinc oxide 40 % topical ointment: ORAL | @ 21:00:00 | NDC 6210332302
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
## 2017-02-08 NOTE — Respiratory Therapy (Signed)
02/08/17 0920       Respiratory Parameters   Start Time 0920   1st Attempt 2.12 Liters   2nd Attempt 3 Liters   3rd Attempt 2.1 Liters   Average FVC 2.4 Liters   1st Attempt -50 cmH2O   2nd Attempt -40 cmH2O   3rd Attempt -40 cmH2O   Average NIF -43 cmH2O   Respiratory Effort Good   $Parameters (Resp only) Completed   Stop Time 0926   Duration 6 Minutes
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
## 2017-02-08 NOTE — Ancillary Notes (Signed)
SBIRT    SBIRT not completed at this time due to patient's mental status - disoriented. Pending recommendations:  Attempt to complete SBIRT at later time/date.    Peyton Bottoms, [facility] Clinical Therapist 02/08/2017, 08:39  Pager  [phone]

## 2017-02-08 NOTE — Nurses Notes (Signed)
Patient last void at 0900 today. Bladder scanned, 582 ml urine. No full bladder sensation per patient. Service made aware.

## 2017-02-08 NOTE — Nurses Notes (Signed)
02/08/17 0500   Urine Assessment   Bladder Scan Performed Y   Estimated Bladder Volume 550 mL       Text paged service. Await for order. Pt denies any urge to urinate.

## 2017-02-09 DIAGNOSIS — Z09 Encounter for follow-up examination after completed treatment for conditions other than malignant neoplasm: Secondary | ICD-10-CM

## 2017-02-09 MED ADMIN — sodium chloride 0.9 % intravenous solution: ORAL | @ 10:00:00 | NDC 00338004904

## 2017-02-09 MED ADMIN — sodium chloride 0.9 % (flush) injection syringe: ORAL | @ 20:00:00

## 2017-02-09 NOTE — Consults (Signed)
Orthopaedic Trauma Progress Note    02/09/2017  16 Days Post-Op S/P I&D L knee, wrist arthrotomy    Subjective: sleeping in bed. Will follow commands.    Labs: BMP:   No results found for this encounter  CBC Results Differential Results   No results found for this encounter No results found for this or any previous visit (from the past 30 hour(s)).       Physical Exam: Blood pressure 114/75, pulse 82, temperature 36 C (96.8 F), resp. rate 18, height 1.905 m (6\' 3" ), weight 95 kg (209 lb 7 oz), SpO2 96 %.  NAD  Answers questions with one word answers but does follow commands  Non labored breathing  LUE:  Grossly grips and extends fingers  Sensation appears to be intact over entire hand  2+ radial pulse  Incision closed, completely dry, sutures out  LLE:  ROM knee from 0-90 actively  Intact ankle pf/df  Nods head "yes" that he has sensation over plantar/dorsal foot  Warm well perfused foot    Assessment/ Plan:   Terry Reid is a 61 y.o. male S/P I&D L wrist, knee arthrotomy  WBAT LLE, LUE  Working on placement to [place]   Patient does not need to travel back to [place] for f/u if he gets accepted to [facility] - wounds are healing well  Work on ROM, no restrictions  PT/OT - ambulation assistance, gait training  Please call with questions    Laurey Arrow, MD 02/09/2017, [facility]  Pager # [phone]

## 2017-02-09 NOTE — Nurses Notes (Signed)
Last void at 0600 today. Bladder scanned 264 ml urine. No full bladder sensation per patient.
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
## 2017-02-10 LAB — CBC WITH DIFF
BASOPHIL #: 0.13 x10ˆ3/uL (ref 0.00–0.20)
BASOPHIL %: 1 %
EOSINOPHIL #: 0.15 x10ˆ3/uL (ref 0.00–0.50)
EOSINOPHIL %: 1 %
HCT: 41.9 % (ref 36.7–47.0)
HGB: 14.4 g/dL (ref 12.5–16.3)
LYMPHOCYTE #: 2.91 x10ˆ3/uL (ref 1.00–4.80)
LYMPHOCYTE %: 27 %
MCH: 30.9 pg (ref 27.4–33.0)
MCHC: 34.3 g/dL (ref 32.5–35.8)
MCV: 90 fL (ref 78.0–100.0)
MONOCYTE #: 1.05 x10ˆ3/uL — ABNORMAL HIGH (ref 0.30–1.00)
MONOCYTE %: 10 %
MPV: 7.5 fL (ref 7.5–11.5)
NEUTROPHIL #: 6.62 x10ˆ3/uL (ref 1.50–7.70)
NEUTROPHIL %: 61 %
PLATELETS: 597 x10ˆ3/uL — ABNORMAL HIGH (ref 140–450)
RBC: 4.65 x10ˆ6/uL (ref 4.06–5.63)
RDW: 12.8 % (ref 12.0–15.0)
WBC: 10.9 x10ˆ3/uL (ref 3.5–11.0)

## 2017-02-10 LAB — BASIC METABOLIC PANEL
ANION GAP: 9 mmol/L (ref 4–13)
BUN/CREA RATIO: 54 — ABNORMAL HIGH (ref 6–22)
BUN: 39 mg/dL — ABNORMAL HIGH (ref 8–25)
CALCIUM: 9.6 mg/dL (ref 8.5–10.2)
CHLORIDE: 106 mmol/L (ref 96–111)
CO2 TOTAL: 23 mmol/L (ref 22–32)
CREATININE: 0.72 mg/dL (ref 0.62–1.27)
ESTIMATED GFR: >59 mL/min/1.73mˆ2 (ref >59)
GLUCOSE: 98 mg/dL (ref 65–139)
POTASSIUM: 4.3 mmol/L (ref 3.5–5.1)
SODIUM: 138 mmol/L (ref 136–145)

## 2017-02-10 MED ORDER — MAGNESIUM HYDROXIDE 400 MG/5 ML ORAL SUSPENSION
30.00 mL | Freq: Two times a day (BID) | ORAL | Status: DC
Start: 2017-02-10 — End: 2017-02-11
  Administered 2017-02-10 (×2): 2400 mg via ORAL
  Filled 2017-02-10 (×2): qty 30

## 2017-02-10 MED ORDER — BISACODYL 10 MG RECTAL SUPPOSITORY
10.0000 mg | Freq: Once | RECTAL | Status: AC
Start: 2017-02-10 — End: 2017-02-10
  Administered 2017-02-10: 10 mg via RECTAL
  Filled 2017-02-10: qty 1

## 2017-02-10 MED ADMIN — sodium chloride 0.9 % (flush) injection syringe: @ 20:00:00

## 2017-02-10 MED ADMIN — nystatin 100,000 unit/gram topical powder: ORAL | @ 20:00:00 | NDC 00574200815

## 2017-02-10 MED ADMIN — sodium chloride 0.9 % intravenous solution: SUBCUTANEOUS | @ 09:00:00 | NDC 00338004904

## 2017-02-10 MED ADMIN — magnesium citrate oral solution: ORAL | @ 14:00:00

## 2017-02-10 MED ADMIN — insulin regular human 100 unit/mL injection solution: ORAL | @ 20:00:00

## 2017-02-10 NOTE — Care Plan (Addendum)
Problem: Patient Care Overview (Adult,OB)  Goal: Plan of Care Review(Adult,OB)  The patient and/or their representative will communicate an understanding of their plan of care   Outcome: Ongoing (see interventions/notes)  Medical Nutrition Therapy Follow Up        SUBJECTIVE : Pt eating breakfast. Denies any issues with eating.  Reports liking the Ensure supplements. Calorie count results below.    OBJECTIVE:     Current Diet Order/Nutrition Support:  MNT PROTOCOL FOR DIETITIAN  DIETARY ORAL SUPPLEMENTS Oral Supplements with tray: Magic Cup-Chocolate; BREAKFAST/LUNCH/DINNER/BEDTIME; 1 Can  DIETARY ORAL SUPPLEMENTS Oral Supplements with tray: Ensure Enlive-Chocolate; BREAKFAST/LUNCH/DINNER/BEDTIME; 1 Can  ROOM SERVICE:  SEND AUTOMATIC HOUSE TRAY  DIET REGULAR Instructional/informational: FEED PATIENT WITH EACH MEAL, POSITION AS UPRIGHT AS POSSIBLE (BETWEEN 45-90 DEGREES) UNLESS CONTRAINDICATED     Height Used for Calculations: 190.5 cm (6' 3" )  Weight Used For Calculations: 84.5 kg (186 lb 4.6 oz)  BMI (kg/m2): 23.33  BMI Assessment: BMI 18.5-24.9: normal          Estimated Needs:    Energy Calorie Requirements: 2350-2550 kcal/day (28-30 kcal/kg 84.5 BW)   Protein Requirements (gms/day): 110-127 grams per day (1.3-1.5 g/84.5 kg)  Fluid Requirements: 2350-2550 ml/day (28-30 ml/kg 84.5 BW) per day    Comments: Pt with R sided palsy      Calorie Count Results    Calorie Count day 1: 1223 calories, 77 grams of protein, which met 70 % of estimated calorie needs and 43 % of estimated protein needs.   Calorie Count day 2: 1776 calories, 125 grams of protein, which met 100 % of estimated calorie needs and 86 % of estimated protein needs.   Calorie Count day 3: 2045 calories, 146 grams of protein, which met 115 % of estimated calorie needs and 100 % of estimated protein needs.     Calorie Count 3 day average: 1681 calories, 116 grams of protein, which met 72 % of estimated calorie needs and 100 % of estimated protein needs.      Recommend :   1.  Continue Regular Diet  2.  Continue supplements as ordered.  Pt has consistently been drinking the Ensure Enlive and this has greatly contributed to his overall calorie/protein intake.  3.  Provide assistance/encouragement with all meals  4.  Continue oral prosource- this contributes 30 g protein per day  5.  Weekly weights  Will continue to follow    Nutrition Diagnosis: Increased nutrient needs related to traumatic injuries as evidenced by need for oral supplements to meet estimated needs per calorie count results (new)    Carmie Kanner, RDLD  02/10/2017, 08:37      Pager # [phone]
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
## 2017-02-10 NOTE — Care Plan (Signed)
[facility]  Occupational Therapy Progress Note    Patient Name: Terry Reid  Date of Birth: August 03, 1956  Height:  190.5 cm (6\' 3" )  Weight:  95 kg (209 lb 7 oz)  Room/Bed: 854/A  Payor: [employer] / Plan: [employer] / Product Type: Auto /     Assessment:    Pt did not otlerate therapy as well today. He requires increased assist for transfers with increased cues for all tasks. Worked on cognitive tasks with cues required t/o with only fair results. WOuld highly recommend TBI rehab at d/c to maximize his level of independence      Discharge Needs:   Equipment Recommendation: to be determined        Discharge Disposition:  inpatient rehabilitation facility (TBI rehab)     JUSTIFICATION OF DISCHARGE RECOMMENDATION   Based on current diagnosis, functional performance prior to admission, and current functional performance, this patient requires continued OT services in inpatient rehabilitation facility (TBI rehab) in order to achieve significant functional improvements.    Plan:   Continue to follow patient according to established plan of care.  The risks/benefits of therapy have been discussed with the patient/caregiver and he/she is in agreement with the established plan of care.     Subjective & Objective:        02/10/17 1141   Therapist Pager   OT Assigned/ Pager # [phone]   Rehab Session   Document Type therapy note (daily note)   Total OT Minutes: 60   Patient Effort adequate   Symptoms Noted During/After Treatment fatigue   General Information   General Observations of Patient seen with PT in rehab gym   Pre Treatment Status   Pre Treatment Patient Status Patient supine in bed   Support Present Pre Treatment  None   Mutuality/Individual Preferences   Mutuality Comment Maxi Move OOB   Pain Assessment   Pre/Post Treatment Pain Comment no c/o pain   Cognitive Assessment/Interventions   Orientation Status  oriented to;person;place;time   Attention mild  impairment;needs re-direction;needs cues to re-direct   Follows Commands  delayed response/completion;increased processing time needed;verbal cues/prompting required;repetition of directions required   Comment also worked on some higher level cognitive tasks with pt. He did rather well on the general knowledge questions. He was able to make chage and tell time. He had difficulty replicating a clock and to a specific time. Pt also has difficulty with some problem solving tasks as well as higher level general knowledge   Bed Mobility Assessment/Treatment   Bed Mobility, Assistive Device draw sheet;Head of Bed Elevated   Comment  requires assist with RLE  and trunk into upright sitting   Sidelying to Sit, Independence moderate assist (50% patient effort);maximum assist (25% patient effort);2 person assist required   Sit to Sidelying, Independence maximum assist (25% patient effort);2 person assist required   Transfer Assessment/Treatment   Sit-Stand Independence  moderate assist (50% patient effort);maximum assist (25% patient effort);2 person assist required;verbal cues required;knee block   Stand-Sit Independence  moderate assist (50% patient effort);maximum assist (25% patient effort);2 person assist required;verbal cues required   Sit-Stand-Sit, Assist Device parallel bars   Toilet Transfer Independence moderate assist (50% patient effort);maximum assist (25% patient effort);2 person assist required;verbal cues required   Toilet Transfer Assist Device other (see comments)  (// bars and then FWW with second time)   Transfer Safety Issues cognitive deficits limit understanding;sequencing ability decreased   Transfer Impairments balance impaired;cognition  impaired;coordination impaired;endurance;strength decreased   Toileting Assessment/Training   Comment dep for hygiene   Grooming Assessment/Training   Comment sat at the sink to complete oral care with use ot R hand and Mod HOH assist   Balance Skill Training    Sitting Balance: Static fair + balance   Sitting, Dynamic (Balance) fair - balance   Sit-to-Stand Balance fair - balance   Standing Balance: Static fair - balance   Standing Balance: Dynamic poor balance   Therapeutic Exercise/Activity   Comment educated again on AAROM to RUE with use of LUE   Post Treatment Status   Post Treatment Patient Status Patient supine in bed;Returned to room via transport   Support Present Post Treatment  None   Occupational Therapy Clinical Impression   Functional Level at Time of Session Pt did not otlerate therapy as well today. He requires increased assist for transfers with increased cues for all tasks. Worked on cognitive tasks with cues required t/o with only fair results. WOuld highly recommend TBI rehab at d/c to maximize his level of independence   Anticipated Equipment Needs at Discharge to be determined   Anticipated Discharge Disposition inpatient rehabilitation facility  (TBI rehab)       Therapist:   Jori Moll, Calton Golds  Pager #: [phone]
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
## 2017-02-11 ENCOUNTER — Inpatient Hospital Stay (HOSPITAL_COMMUNITY): Payer: BC Managed Care – PPO

## 2017-02-11 DIAGNOSIS — S2232XA Fracture of one rib, left side, initial encounter for closed fracture: Secondary | ICD-10-CM

## 2017-02-11 LAB — URINALYSIS, MACROSCOPIC
BILIRUBIN: NEGATIVE mg/dL
BLOOD: NEGATIVE mg/dL
COLOR: NORMAL
GLUCOSE: NEGATIVE mg/dL
KETONES: NEGATIVE mg/dL
LEUKOCYTES: NEGATIVE WBCs/uL
NITRITE: NEGATIVE
PH: 5 (ref 5.0–8.0)
PROTEIN: NEGATIVE mg/dL
SPECIFIC GRAVITY: 1.019 (ref 1.005–1.030)
SPECIFIC GRAVITY: 1.019 (ref 1.005–1.030)
UROBILINOGEN: NEGATIVE mg/dL

## 2017-02-11 LAB — URINALYSIS, MICROSCOPIC
RBCS: 0 /HPF (ref <6.0)
WBCS: 3 /HPF (ref <4.0)

## 2017-02-11 MED ADMIN — propranoloL 40 mg tablet: ORAL | @ 20:00:00

## 2017-02-11 MED ADMIN — rifAXIMin 200 mg tablet: ORAL | @ 14:00:00

## 2017-02-11 NOTE — Nurses Notes (Signed)
Patient back in room from rehab gym.
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
## 2017-02-11 NOTE — Discharge Instructions (Signed)
Discharge Recommendations/ Plan:Discharge HC:SPZZC Rehab Placement/Return (not psych) (code 97)      Resources: [facility] [phone] for report.

## 2017-02-11 NOTE — Nurses Notes (Signed)
Patient straight cathed for 718ml of medium yellow urine. Tolerated well.

## 2017-02-11 NOTE — Care Plan (Signed)
[place]  Occupational Therapy Progress Note    Patient Name: Terry Reid  Date of Birth: 1955/12/12  Height:  190.5 cm (6\' 3" )  Weight:  95 kg (209 lb 7 oz)  Room/Bed: 854/A  Payor: [employer] / Plan: [employer] / Product Type: Auto /     Assessment:    Pt with improved therapt session today. He was able to follow commands better, had imprpved and controlled movement in RLE as well as RUE today. Tolerated Moveo ~25 degrees for ~25 minutes today TBI rehab      Discharge Needs:   Equipment Recommendation: to be determined        Discharge Disposition:  inpatient rehabilitation facility (TBI rehab)     JUSTIFICATION OF DISCHARGE RECOMMENDATION   Based on current diagnosis, functional performance prior to admission, and current functional performance, this patient requires continued OT services in inpatient rehabilitation facility (TBI rehab) in order to achieve significant functional improvements.    Plan:   Continue to follow patient according to established plan of care.  The risks/benefits of therapy have been discussed with the patient/caregiver and he/she is in agreement with the established plan of care.     Subjective & Objective:        02/11/17 1209   Therapist Pager   OT Assigned/ Pager # Kennyth Lose [place]   Rehab Session   Document Type therapy note (daily note)   Total OT Minutes: 48   Patient Effort good   Symptoms Noted During/After Treatment fatigue   General Information   General Observations of Patient seen in rehab dpet RN approved   Pre Treatment Status   Pre Treatment Patient Status Patient seen in Rehab department in bed   Support Present Pre Treatment  None   Pain Assessment   Pre/Post Treatment Pain Comment no c/o pain   Mobility Assessment/Training   Mobility Comment laterat tranfewr form bed <> the [facility]   Therapeutic Exercise/Activity   Comment pt worked on LE strengthening and proprioceptive movement in RLE and tolerates ~25 degrees today.  Also worked on [employer] with use of co-movements of LUE with use of ball, Good toelrance today with increased active RLE knee extension and control and RUE in the shoulder   Post Treatment Status   Post Treatment Patient Status Patient supine in bed;Returned to room via transport   Support Present Post Treatment  None   Occupational Therapy Clinical Impression   Functional Level at Time of Session Pt with improved therapt session today. He was able to follow commands better, had imprpved and controlled movement in RLE as well as RUE today. Tolerated Moveo ~25 degrees for ~25 minutes today TBI rehab   Anticipated Equipment Needs at Discharge to be determined   Anticipated Discharge Disposition inpatient rehabilitation facility  (TBI rehab)       Therapist:   Jori Moll, [place]  Pager #: [phone]

## 2017-02-11 NOTE — Care Plan (Signed)
[place]  Physical Therapy Progress Note      Patient Name: Terry Reid  Date of Birth: 05-18-1956  Height:  190.5 cm (6\' 3" )  Weight:  95 kg (209 lb 7 oz)  Room/Bed: 854/A  Payor: [employer] / Plan: [employer] / Product Type: Auto /     Assessment:     Good tolerance to PT treatment this date. Pt demonstrating improvements in BLE strength. Improved control of RLE this date. Presents with decreased activity tolerance, endurance and strength. Motivated to participate. Will follow.     Discharge Needs:   Equipment Recommendation: TBD     Discharge Disposition: inpatient rehabilitation facility    JUSTIFICATION OF DISCHARGE RECOMMENDATION   Based on current diagnosis, functional performance prior to admission, and current functional performance, this patient requires continued PT services in inpatient rehabilitation facility in order to achieve significant functional improvements in these deficit areas: arousal, attention, and cognition, aerobic capacity/endurance, gait, locomotion, and balance, ROM (range of motion), muscle performance.      Plan:   Continue to follow patient according to established plan of care.  The risks/benefits of therapy have been discussed with the patient/caregiver and he/she is in agreement with the established plan of care.     Subjective & Objective:        02/11/17 1210   Therapist Pager   PT Assigned/ Pager # ev [phone]   Rehab Session   Document Type therapy note (daily note)   Total PT Minutes: 31   Patient Effort good   Symptoms Noted During/After Treatment fatigue   General Information   Patient Profile Reviewed? yes   Patient/Family Observations Pt seen in therapy gym this date, supine in bed, RN approved session, seen with professional assist of OT   Respiratory Status room air   Existing Precautions/Restrictions fall precautions;full code;weight bearing restriction   Weight-bearing Status   Extremity Weight-bearing Status left upper  extremity;left lower extremity   Left Upper Extremity weight-bearing as tolerated (WBAT)   Left Lower Extremity weight-bearing as tolerated (WBAT)   Mutuality/Individual Preferences   Patient Specific Interventions OOB with maxi move   Pre Treatment Status   Pre Treatment Patient Status Patient seen in Rehab department in bed   Support Present Pre Treatment  None   Pain Assessment   Pre/Post Treatment Pain Comment no c/o pain   Bed Mobility Assessment/Treatment   Comment  lateral transfer to/from bed/Moveo with 3 person assist and maxi slide   Therapeutic Exercise/Activity   Comment Moveo for LE strengthening and WB: tolerated 20 degrees (gradual increase) - performed 3 x 10 reps of squats with cues for technique; good control of RLE this date; able to actively perform knee extension on the R; tactile cues to perform knee flexion on the R; rest breaks provided as needed d/t fatigue   Post Treatment Status   Post Treatment Patient Status Patient supine in bed;Returned to room via transport   Support Present Post Treatment  Other (See comments)  (transport present)   Plan of Care Review   Plan Of Care Reviewed With patient   Physical Therapy Clinical Impression   Assessment Good tolerance to PT treatment this date. Pt demonstrating improvements in BLE strength. Improved control of RLE this date. Presents with decreased activity tolerance, endurance and strength. Motivated to participate. Will follow.    Anticipated Equipment Needs at Discharge (PT Clinical Impression) TBD   Anticipated Discharge Disposition  inpatient rehabilitation facility  Therapist:   Blanche East, PTA   Pager #: [phone]

## 2017-02-11 NOTE — Nurses Notes (Signed)
Received call from [facility] ambulance, who will transport patient tomorrow to nursing facility by helicopter. Report given to [facility] at number [phone]. Stated they would arrive to transport patient at 10:00.

## 2017-02-11 NOTE — Nurses Notes (Signed)
Patient taken to rehab gym at this time.

## 2017-02-11 NOTE — Nurses Notes (Signed)
Notified service that pt has not voided. Bladder scanned for 778ml.

## 2017-02-11 NOTE — Nurses Notes (Signed)
Notified service of bladder scan results. Waiting for reply.

## 2017-02-11 NOTE — Care Plan (Signed)
[place]  Physical Therapy Progress Note      Patient Name: Terry Reid  Date of Birth: 27-Mar-1956  Height:  190.5 cm (6\' 3" )  Weight:  95 kg (209 lb 7 oz)  Room/Bed: 854/A  Payor: [employer] / Plan: [employer] / Product Type: Auto /     Assessment:     Fair tolerance to PT treatment this date. Pt presented with increased fatigue this date and increased confusion. Tolerated multiple sit to/from stand transfers with 2 person assist. Presents with decreased activity tolerance, endurance and strength. Will follow.     Discharge Needs:   Equipment Recommendation: TBD    Discharge Disposition: inpatient rehabilitation facility    JUSTIFICATION OF DISCHARGE RECOMMENDATION   Based on current diagnosis, functional performance prior to admission, and current functional performance, this patient requires continued PT services in inpatient rehabilitation facility in order to achieve significant functional improvements in these deficit areas: arousal, attention, and cognition, aerobic capacity/endurance, gait, locomotion, and balance, ROM (range of motion), muscle performance.      Plan:   Continue to follow patient according to established plan of care.  The risks/benefits of therapy have been discussed with the patient/caregiver and he/she is in agreement with the established plan of care.     Subjective & Objective:        02/10/17 1142   Therapist Pager   PT Assigned/ Pager # ev [phone]   Rehab Session   Document Type therapy note (daily note)   Total PT Minutes: 60   Patient Effort adequate   Symptoms Noted During/After Treatment fatigue   General Information   Patient Profile Reviewed? yes   Patient/Family Observations Pt seen in therapy gym this date, supine in bed, RN approved session, seen with professional assist of OT   Respiratory Status room air   Existing Precautions/Restrictions fall precautions;full code;weight bearing restriction   Weight-bearing Status   Extremity  Weight-bearing Status left upper extremity;left lower extremity   Left Upper Extremity weight-bearing as tolerated (WBAT)   Left Lower Extremity weight-bearing as tolerated (WBAT)   Mutuality/Individual Preferences   Patient Specific Interventions OOB with maxi move   Pre Treatment Status   Pre Treatment Patient Status Patient seen in Rehab department in bed   Support Present Pre Treatment  None   Cognitive Assessment/Interventions   Comment see OT note re: cognitive exercises performed   Pain Assessment   Pre/Post Treatment Pain Comment no c/o pain   Bed Mobility Assessment/Treatment   Bed Mobility, Assistive Device draw sheet;Head of Bed Elevated   Safety Issues cognitive deficits limit understanding;decreased use of arms for pushing/pulling;decreased use of legs for bridging/pushing;impaired trunk control for bed mobility   Impairments balance impaired;cognition impaired;coordination impaired;endurance;flexibility decreased;postural control impaired;strength decreased   Comment  assist provided for RLE; posterior lean in sitting and requires max assist to maintain balance initially   Sidelying to Sit, [place] verbal cues required;maximum assist (25% patient effort);2 person assist required   Sit to Sidelying, [place] verbal cues required;maximum assist (25% patient effort);2 person assist required   Transfer Assessment/Treatment   Sit-Stand Independence  verbal cues required;maximum assist (25% patient effort);2 person assist required   Stand-Sit [place]  verbal cues required;maximum assist (25% patient effort);2 person assist required   Sit-Stand-Sit, Assist Device parallel bars   Toilet Transfer Independence maximum assist (25% patient effort);2 person assist required   Toilet Transfer Assist Device other (see comments)  (parallel bars)   Transfer Safety Issues  cognitive deficits limit understanding;weight-shifting ability decreased   Transfer Impairments balance impaired;cognition impaired    Transfer Comment performed multiple sit to/from stand with max assist x 2 and use of parallel bars; presents with lateral lean to the right   Gait Assessment/Treatment   Comment not appropriate at this time   Balance Skill Training   Sitting Balance: Static fair + balance   Sitting, Dynamic (Balance) fair - balance   Sit-to-Stand Balance fair - balance   Standing Balance: Static fair - balance   Standing Balance: Dynamic poor balance   Systems Impairment Contributing to Balance Disturbance cognitive;musculoskeletal   Identified Impairments Contributing to Balance Disturbance impaired coordination;impaired postural control;decreased strength   Therapeutic Exercise/Activity   Comment PROM to RLE - no active movement noted   Post Treatment Status   Post Treatment Patient Status Patient supine in bed;Returned to room via transport   Support Present Post Treatment  Other (See comments)  (transport present)   Plan of Care Review   Plan Of Care Reviewed With patient   Physical Therapy Clinical Impression   Assessment Fair tolerance to PT treatment this date. Pt presented with increased fatigue this date and increased confusion. Tolerated multiple sit to/from stand transfers with 2 person assist. Presents with decreased activity tolerance, endurance and strength. Will follow.    Anticipated Equipment Needs at Discharge (PT Clinical Impression) TBD   Anticipated Discharge Disposition  inpatient rehabilitation facility       Therapist:   Blanche East, [place]   Pager #: [phone]

## 2017-02-11 NOTE — Ancillary Notes (Signed)
SBIRT    SBIRT completed at this time, as per [place].  Pending recommendations:  None.    Kem Boroughs, LICSW Clinical Therapist 02/11/2017, 11:04    Pager  [phone]

## 2017-02-12 MED ORDER — MELATONIN 3 MG TABLET: 3 mg | Freq: Every evening | ORAL | 0 refills | 0 days | Status: AC

## 2017-02-12 MED ORDER — ENOXAPARIN 30 MG/0.3 ML SUBCUTANEOUS SYRINGE: 30 mg | Freq: Two times a day (BID) | SUBCUTANEOUS | 0 refills | 0 days | Status: AC

## 2017-02-12 MED ORDER — SENNA LEAF EXTRACT 176 MG/5 ML ORAL SYRUP: 5 mL | Freq: Two times a day (BID) | ORAL | 0 refills | 0 days | Status: AC | PRN

## 2017-02-12 MED ORDER — FAMOTIDINE 20 MG TABLET: 20 mg | Freq: Two times a day (BID) | ORAL | 0 refills | 0 days | Status: AC

## 2017-02-12 MED ORDER — PROPRANOLOL 40 MG TABLET
40.00 mg | ORAL_TABLET | Freq: Three times a day (TID) | ORAL | Status: AC
Start: 2017-02-12 — End: ?

## 2017-02-12 MED ORDER — ACETAMINOPHEN 325 MG TABLET
975.00 mg | ORAL_TABLET | Freq: Three times a day (TID) | ORAL | 0 refills | Status: AC
Start: 2017-02-12 — End: ?

## 2017-02-12 MED ORDER — ALBUTEROL SULFATE 2.5 MG/3 ML (0.083 %) SOLUTION FOR NEBULIZATION: 3 mg | Freq: Four times a day (QID) | RESPIRATORY_TRACT | 0 refills | 0 days | Status: AC | PRN

## 2017-02-12 MED ORDER — NUTRITION PROTEIN SUPPLEMENT: 2 | Freq: Two times a day (BID) | 0 refills | 0 days | Status: AC

## 2017-02-12 MED ORDER — DOCUSATE SODIUM 50 MG/5 ML ORAL LIQUID
100.00 mg | Freq: Every day | ORAL | Status: AC | PRN
Start: 2017-02-12 — End: ?

## 2017-02-12 MED ADMIN — DEXTROSE 10% E48 W/ ADDITIVES: ORAL | @ 09:00:00
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
## 2017-02-12 NOTE — Nurses Notes (Signed)
Report called to Weldon Picking, RN at [facility] rehab facility in [place].
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
## 2017-02-13 NOTE — Care Management Notes (Signed)
Referral Information  ++++++ Placed Provider #1 ++++++  Case Manager: Charles Sears  Provider Type: Nursing Home/SNF  Address:  ,    Contact:    Fax:   Fax:

## 2017-02-13 NOTE — Care Management Notes (Signed)
Referral Information  ++++++ Placed Provider #1 ++++++  Case Manager: Evalina Field  Provider Type: Acute -Rehab  Provider Name: [facility]  Address:  [address] [place], [place] [id number]  Contact:    Fax:   Fax:

## 2017-02-14 ENCOUNTER — Encounter (HOSPITAL_COMMUNITY): Payer: Self-pay | Admitting: PULMONARY DISEASE

## 2017-02-17 ENCOUNTER — Ambulatory Visit (INDEPENDENT_AMBULATORY_CARE_PROVIDER_SITE_OTHER): Payer: Self-pay | Admitting: Family Medicine
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
## 2017-02-20 ENCOUNTER — Ambulatory Visit (INDEPENDENT_AMBULATORY_CARE_PROVIDER_SITE_OTHER): Payer: Self-pay | Admitting: Anesthesiology

## 2017-02-20 NOTE — Telephone Encounter (Signed)
I called and spoke with the patient's wife about getting the appointment rescheduled. She said that Terry Reid is living in , [place] and he is following up with all of his doctors there. She asked that the order be removed.  Hamilton Capri, LPN  7/54/4920, 10:07

## 2017-02-28 ENCOUNTER — Other Ambulatory Visit (HOSPITAL_COMMUNITY): Payer: Self-pay

## 2017-02-28 ENCOUNTER — Encounter (INDEPENDENT_AMBULATORY_CARE_PROVIDER_SITE_OTHER): Payer: Self-pay | Admitting: Anesthesiology

## 2017-03-21 ENCOUNTER — Other Ambulatory Visit (HOSPITAL_COMMUNITY): Payer: Self-pay

## 2017-03-21 ENCOUNTER — Encounter (INDEPENDENT_AMBULATORY_CARE_PROVIDER_SITE_OTHER): Payer: Self-pay | Admitting: Anesthesiology

## 2017-05-27 ENCOUNTER — Ambulatory Visit: Payer: BLUE CROSS/BLUE SHIELD

## 2017-05-27 ENCOUNTER — Ambulatory Visit: Payer: BLUE CROSS/BLUE SHIELD | Admitting: Occupational Therapy

## 2017-05-27 ENCOUNTER — Ambulatory Visit: Payer: BLUE CROSS/BLUE SHIELD | Attending: Physical Medicine & Rehabilitation | Admitting: Physical Therapy

## 2017-05-27 DIAGNOSIS — R41841 Cognitive communication deficit: Secondary | ICD-10-CM | POA: Insufficient documentation

## 2017-05-27 DIAGNOSIS — R471 Dysarthria and anarthria: Secondary | ICD-10-CM

## 2017-05-27 DIAGNOSIS — R2681 Unsteadiness on feet: Secondary | ICD-10-CM | POA: Diagnosis present

## 2017-05-27 DIAGNOSIS — R278 Other lack of coordination: Secondary | ICD-10-CM | POA: Diagnosis present

## 2017-05-27 DIAGNOSIS — R29818 Other symptoms and signs involving the nervous system: Secondary | ICD-10-CM | POA: Insufficient documentation

## 2017-05-27 DIAGNOSIS — R41840 Attention and concentration deficit: Secondary | ICD-10-CM | POA: Insufficient documentation

## 2017-05-27 DIAGNOSIS — R2689 Other abnormalities of gait and mobility: Secondary | ICD-10-CM | POA: Insufficient documentation

## 2017-05-27 NOTE — Patient Instructions (Signed)
Work on divided attention tasks: unload dishwasher and name 10 places you've been in the past year, play checkers and listen to a novel and give summary of plot after 2-3 pages, etc.
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
## 2017-05-30 ENCOUNTER — Ambulatory Visit: Payer: BLUE CROSS/BLUE SHIELD | Admitting: Physical Therapy

## 2017-05-30 ENCOUNTER — Encounter: Payer: Self-pay | Admitting: Physical Therapy

## 2017-05-30 ENCOUNTER — Ambulatory Visit: Payer: BLUE CROSS/BLUE SHIELD | Admitting: *Deleted

## 2017-05-30 DIAGNOSIS — R2689 Other abnormalities of gait and mobility: Secondary | ICD-10-CM

## 2017-05-30 DIAGNOSIS — R2681 Unsteadiness on feet: Secondary | ICD-10-CM

## 2017-05-30 DIAGNOSIS — R41841 Cognitive communication deficit: Secondary | ICD-10-CM

## 2017-05-30 NOTE — Patient Instructions (Addendum)
Perform these at the counter for support at needed: . Walking on Heels   At counter: Walk on heels forward while continuing on a straight path, and then walk on heels backward to starting position. Repeat for 3 laps each way. Do _1-2__ sessions per day.  Copyright  VHI. All rights reserved.  Walking on Toes   At counter for balance as needed: Walk on toes forward while continuing on a straight path, and then backwards on toes to starting position. Repeat 3 laps each way. Do _1-2___ sessions per day.  Copyright  VHI. All rights reserved.  Feet Heel-Toe "Tandem"   At counter: Arms at sides, walk a straight line forward bringing one foot directly in front of the other, and then a straight line backwards bringing one foot directly behind the other one.  Repeat for _3 laps each way. Do _1-2_ sessions per day.  Copyright  VHI. All rights reserved.   Perform these in the corner with a chair in front for safety:  Feet Together (Compliant Surface) Varied Arm Positions - Eyes Closed    Stand on compliant surface: _pillow/s_ with feet together and arms at sides. Close eyes and visualize upright position. Hold__30__ seconds. Repeat __3__ times per session. Do __1-2__ sessions per day.  Copyright  VHI. All rights reserved.   Feet Together (Compliant Surface) Head Motion - Eyes Closed    Stand on compliant surface: _pillow/s_ with feet together. Close eyes and move head slowly: 1. Up and down x 10 reps 2. Right and left x 10 reps 3. Up right/down left x 10 reps 4. Up left/down right x 10 reps Do 1-2 sessions per day.  Copyright  VHI. All rights reserved.

## 2017-05-30 NOTE — Therapy (Signed)
[facility] [address] [place], [place], [address] Phone: [phone]   Fax:  [phone]  Speech Language Pathology Treatment  Patient Details  Name: Jeremiah Osborn MRN: [id number] Date of Birth: 09/13/1956 Referring Provider: Sharion Dove, MD  Encounter Date: 05/30/2017      End of Session - 05/30/17 1309    Visit Number 2   Number of Visits 17   Date for SLP Re-Evaluation 08/01/17   Authorization Type BCBS   Authorization - Visit Number 2   Authorization - Number of Visits 12   SLP Start Time 1200   SLP Stop Time  1443   SLP Time Calculation (min) 45 min   Activity Tolerance Patient tolerated treatment well      No past medical history on file.  No past surgical history on file.  There were no vitals filed for this visit.      Subjective Assessment - 05/30/17 1307    Subjective I'm a "get it right" kind of person   Patient is accompained by: Family member  wife [name]               ADULT SLP TREATMENT - 05/30/17 0001      General Information   Behavior/Cognition Alert;Cooperative;Pleasant mood     Treatment Provided   Treatment provided Cognitive-Linquistic     Pain Assessment   Pain Assessment No/denies pain     Cognitive-Linquistic Treatment   Treatment focused on Cognition;Patient/family/caregiver education   Skilled Treatment Skilled session focused on review of evaluation and goals, as well as discussion of areas of functional deficit and compensation. Pt was able to verbalize difficulty with recall and attention, and reports increased difficulty multitasking since his accident. Pt also verbalized awareness of the effect of fatigue on performance and behavior (increased agitation, decreased frustration tolerance). Pt reports use of compensatory strategies for improving functional recall, including use of associations, list making, and calendar entries. Pt also admits to some of poor  recall being due to poor attention. One episode of word retrieval difficulty noted (unable to recall "major" when discussing wife's area of study in college (music major). More often, wife reports, pt has difficulty with perseverating on a word. Otherwise, pt was able to engage in conversation and answer questions without obvious word finding difficulty. Pt was given written information on diaphragmatic breathing exercises, and the process was reviewed with pt/wife. They were encouraged to practice breathing exercises this weekend. Pt was also encouraged to be mindful of areas of functional difficulty in attention and memory as he participates in daily routines and activities, to discuss and problem solve in treatment.      Assessment / Recommendations / Plan   Plan Continue with current plan of care     Progression Toward Goals   Progression toward goals Progressing toward goals          SLP Education - 05/30/17 1308    Education provided Yes   Education Details diaphragmatic breathing exercises, evaluate areas of functional difficulty attn vs memory   Person(s) Educated Patient;Spouse   Methods Explanation;Demonstration   Comprehension Verbalized understanding          SLP Short Term Goals - 05/30/17 1312      SLP SHORT TERM GOAL #1   Title Pt will demo abdominal breathing in 18/20 sentences over three sessions   Time 4   Period Weeks   Status On-going     SLP SHORT TERM GOAL #2  Title pt will demo emergent awareness (re: memory) in cognitive linguistic tasks with modified independence (nonverbal cues, careful listening, etc)   Time 4   Period Weeks   Status On-going     SLP SHORT TERM GOAL #3   Title pt will tell SLP 4 memory strategies over two sessions with modified independence   Baseline 8/24 = association, making lists, calendar   Time 4   Period Weeks   Status On-going          SLP Long Term Goals - 05/30/17 1312      SLP LONG TERM GOAL #1   Title pt will  use a memory compensation five times during therapy   Time 8   Period Weeks   Status On-going     SLP LONG TERM GOAL #2   Title pt will demo anticipatory awareness in 5 therapy sessions   Time 8   Period Weeks   Status On-going     SLP LONG TERM GOAL #3   Title pt will use abdominal breathing average 70% of the time in 10 minutes simple to mod complex conversation   Time 8   Period Weeks   Status On-going          Plan - 05/30/17 1310    Clinical Impression Statement Pt pleasant and cooperative with unfamiliar therapist. SLP reviewed goals established and discussed areas of functional difficulty with pt. Highest levels of attention - divided and alternating - and functional recall are most problematic. Continued ST intervention is recommended to facilitate return to work and PLOF.   Speech Therapy Frequency 2x / week   Duration --  8 weeks/17 visits   Treatment/Interventions Compensatory strategies;Patient/family education;Functional tasks;Cueing hierarchy;Cognitive reorganization;Compensatory techniques;Language facilitation;Internal/external aids;SLP instruction and feedback;Multimodal communcation approach   Potential to Achieve Goals Good   Potential Considerations Ability to learn/carryover information;Family/community support;Previous level of function;Cooperation/participation level   Consulted and Agree with Plan of Care Patient;Family member/caregiver   Family Member Consulted wife      Patient will benefit from skilled therapeutic intervention in order to improve the following deficits and impairments:   Cognitive communication deficit    Problem List There are no active problems to display for this patient.  Naylin Burkle B. Quentin Ore, [facility], CCC-SLP Speech Pathologist  Shonna Chock 05/30/2017, 1:13 PM  [facility] [address] [place], [place], [address] Phone: [phone]   Fax:   [phone]   Name: JAHSIR RAMA MRN: [id number] Date of Birth: October 02, 1956

## 2017-05-31 NOTE — Therapy (Signed)
[facility] [address] [place], [place], [address] Phone: [phone]   Fax:  [phone]  Physical Therapy Treatment  Patient Details  Name: Jeremiah Osborn MRN: [id number] Date of Birth: 1955-10-09 Referring Provider: Sharion Dove MD  Encounter Date: 05/30/2017      PT End of Session - 05/30/17 1022    Visit Number 2   Number of Visits 18   Date for PT Re-Evaluation 07/08/17  12th visit   Authorization Type BCBS- 12 visit limit per discipline   Authorization - Visit Number 2   Authorization - Number of Visits 12  may apply for visits needed beyond 12   PT Start Time 1018   PT Stop Time 1100   PT Time Calculation (min) 42 min   Equipment Utilized During Treatment Gait belt   Activity Tolerance Patient tolerated treatment well   Behavior During Therapy [facility] for tasks assessed/performed      History reviewed. No pertinent past medical history.  History reviewed. No pertinent surgical history.  There were no vitals filed for this visit.      Subjective Assessment - 05/30/17 1021    Subjective No new complaitns. No falls or pain to report   Patient is accompained by: Family member   Patient Stated Goals Want to improve his walking; to not have to think so hard about initiating steps or when turning corners, curb, unlevel pavement; build endurance   Currently in Pain? No/denies            [facility] PT Assessment - 05/30/17 1023      Functional Gait  Assessment   Gait assessed  Yes   Gait Level Surface Walks 20 ft in less than 7 sec but greater than 5.5 sec, uses assistive device, slower speed, mild gait deviations, or deviates 6-10 in outside of the 12 in walkway width.  6.53   Change in Gait Speed Able to smoothly change walking speed without loss of balance or gait deviation. Deviate no more than 6 in outside of the 12 in walkway width.   Gait with Horizontal Head Turns Performs head turns smoothly with no  change in gait. Deviates no more than 6 in outside 12 in walkway width   Gait with Vertical Head Turns Performs head turns with no change in gait. Deviates no more than 6 in outside 12 in walkway width.   Gait and Pivot Turn Pivot turns safely in greater than 3 sec and stops with no loss of balance, or pivot turns safely within 3 sec and stops with mild imbalance, requires small steps to catch balance.  5 sec's   Step Over Obstacle Is able to step over one shoe box (4.5 in total height) without changing gait speed. No evidence of imbalance.   Gait with Narrow Base of Support Ambulates less than 4 steps heel to toe or cannot perform without assistance.   Gait with Eyes Closed Walks 20 ft, uses assistive device, slower speed, mild gait deviations, deviates 6-10 in outside 12 in walkway width. Ambulates 20 ft in less than 9 sec but greater than 7 sec.  7.65 sec's   Ambulating Backwards Walks 20 ft, no assistive devices, good speed, no evidence for imbalance, normal gait   Steps Alternating feet, must use rail.   Total Score 22     issued the following to HEP:  Perform these at the counter for support at needed: . Walking on Heels   At counter: Walk on  heels forward while continuing on a straight path, and then walk on heels backward to starting position. Repeat for 3 laps each way. Do _1-2__ sessions per day.  Copyright  VHI. All rights reserved.  Walking on Toes   At counter for balance as needed: Walk on toes forward while continuing on a straight path, and then backwards on toes to starting position. Repeat 3 laps each way. Do _1-2___ sessions per day.  Copyright  VHI. All rights reserved.  Feet Heel-Toe "Tandem"   At counter: Arms at sides, walk a straight line forward bringing one foot directly in front of the other, and then a straight line backwards bringing one foot directly behind the other one.  Repeat for _3 laps each way. Do _1-2_ sessions per day.  Copyright  VHI. All  rights reserved.   Perform these in the corner with a chair in front for safety:  Feet Together (Compliant Surface) Varied Arm Positions - Eyes Closed    Stand on compliant surface: _pillow/s_ with feet together and arms at sides. Close eyes and visualize upright position. Hold__30__ seconds. Repeat __3__ times per session. Do __1-2__ sessions per day.  Copyright  VHI. All rights reserved.   Feet Together (Compliant Surface) Head Motion - Eyes Closed    Stand on compliant surface: _pillow/s_ with feet together. Close eyes and move head slowly: 1. Up and down x 10 reps 2. Right and left x 10 reps 3. Up right/down left x 10 reps 4. Up left/down right x 10 reps Do 1-2 sessions per day.  Copyright  VHI. All rights reserved.           [facility] Adult PT Treatment/Exercise - 05/30/17 1049      Neuro Re-ed    Neuro Re-ed Details  along ~50 foot hallway: forward walking with head movements left<>fwd<>right, up<>fwd<>down, and then diagonals both ways. Min guard to min assist for balance with veering and decr gait speed noted with head movements. Mild dizziness with diagonal head motions reported.            PT Education - 05/30/17 1047    Education provided Yes   Education Details results of FGA; HEP for balance at home   Person(s) Educated Patient;Spouse   Methods Explanation;Demonstration;Verbal cues;Handout   Comprehension Verbalized understanding;Returned demonstration;Verbal cues required;Need further instruction          PT Short Term Goals - 05/31/17 1303      PT SHORT TERM GOAL #1   Title Patient will complete HEP under supervision of his wife (due to cognitive status). (TARGET for all STGs by 6th of 12 approved visits)   Status On-going     PT SHORT TERM GOAL #2   Title Patient will decrease time for 5x sit to stand to <13.5 seconds to demonstrate improved LE strength and balance.    Status On-going     PT SHORT TERM GOAL #3   Title Patient will improve  gait velocity to >3.0 ft/sec with no LOB or staggering.    Status On-going     PT SHORT TERM GOAL #4   Title Patient will complete FGA to establish baseline score. Will improve score by 3 points by 6th visit.   Baseline 05/30/17: 22/30 baseline value established today   Status On-going           PT Long Term Goals - 05/27/17 2005      PT LONG TERM GOAL #1   Title Patient will improve 5x sit to  stand time to <10 sec (approaching norm of 8.4 sec) demonstrating improved LE strength and overall balance. (TARGET for all LTGs by 12th visit)   Status New     PT LONG TERM GOAL #2   Title Patient will improve gait velocity to >=3.11 ft/sec (norm for his age group) on level surface without LOB.    Status New     PT LONG TERM GOAL #3   Title Patient will improve FGA score to >= 27/30 to demonstrate improved balance and lesser fall risk.    Status New     PT LONG TERM GOAL #4   Title Patient ambulating >1000 ft over unlevel terrain (grass, gravel, mulch, inclines/hill, declines, ramp, curb) with supervision only (no assist required for any LOB)   Status New     PT LONG TERM GOAL #5   Title Patient will verbalize plan for continued activity upon discharge from PT.    Status New            Plan - 05/30/17 1022    Clinical Impression Statement Today's skilled session initially focused on performance of functional gait index to determine pt's baseline score with 22/30 as result. Remainder of session addressed establishment of HEP and then worked on high level dynamic balance for remainder of session. Pt is progressing toward goals and should benefit from continued PT to progress toward unmet goals. Pt and spouse also made aware of the 12  visit's approved by insurance and the option of appeal for more if needed. Both verbalized understanding.  Pt's medication list was updated this visist as well.                                      Rehab Potential Good   PT Frequency 2x / week   PT  Duration --  12 approved visits   PT Treatment/Interventions ADLs/Self Care Home Management;DME Instruction;Gait training;Stair training;Functional mobility training;Therapeutic activities;Therapeutic exercise;Balance training;Neuromuscular re-education;Cognitive remediation;Patient/family education   PT Next Visit Plan continue to work on high level dynamic balance, balance on compliant surfaces and balance with vision removed. add to HEP as needed.    Consulted and Agree with Plan of Care Patient;Family member/caregiver   Family Member Consulted wife, [name]      Patient will benefit from skilled therapeutic intervention in order to improve the following deficits and impairments:  Abnormal gait, Decreased activity tolerance, Decreased balance, Decreased cognition, Decreased coordination, Decreased safety awareness, Decreased strength, Impaired perceived functional ability  Visit Diagnosis: Other abnormalities of gait and mobility  Unsteadiness on feet     Problem List There are no active problems to display for this patient.   Willow Ora, PTA, [facility] [address], [place], [place] [address] [phone] 05/31/17, 1:13 PM   Name: Jeremiah Osborn MRN: [id number] Date of Birth: [birth year]

## 2017-06-03 ENCOUNTER — Ambulatory Visit: Payer: BLUE CROSS/BLUE SHIELD | Admitting: Physical Therapy

## 2017-06-03 ENCOUNTER — Ambulatory Visit: Payer: BLUE CROSS/BLUE SHIELD | Admitting: *Deleted

## 2017-06-03 DIAGNOSIS — R2689 Other abnormalities of gait and mobility: Secondary | ICD-10-CM | POA: Diagnosis not present

## 2017-06-03 DIAGNOSIS — R41841 Cognitive communication deficit: Secondary | ICD-10-CM

## 2017-06-03 NOTE — Therapy (Signed)
[facility] [address] [place], [place], [address] Phone: [phone]   Fax:  [phone]  Speech Language Pathology Treatment  Patient Details  Name: Jeremiah Osborn MRN: [id number] Date of Birth: 09-Nov-1955 Referring Provider: Sharion Dove, MD  Encounter Date: 06/03/2017      End of Session - 06/03/17 1445    Visit Number 3   Number of Visits 17   Date for SLP Re-Evaluation 08/01/17   Authorization Type BCBS   Authorization - Visit Number 3   Authorization - Number of Visits 12   SLP Start Time 3662   SLP Stop Time  1400   SLP Time Calculation (min) 45 min   Activity Tolerance Patient tolerated treatment well      No past medical history on file.  No past surgical history on file.  There were no vitals filed for this visit.      Subjective Assessment - 06/03/17 1324    Subjective I didn't do the breathing exercises you gave me               ADULT SLP TREATMENT - 06/03/17 0001      General Information   Behavior/Cognition Alert;Cooperative;Pleasant mood     Treatment Provided   Treatment provided Cognitive-Linquistic     Pain Assessment   Pain Assessment No/denies pain     Cognitive-Linquistic Treatment   Treatment focused on Cognition;Patient/family/caregiver education   Skilled Treatment Skilled ST session focused on completion of functional divided and alternating attention tasks at home. Pt also discussed concerns for progress he will continue to make, indicating he likes to stay busy and is very active. SLP and pt wrote out a list of questions for self-assessment regarding barriers to returning to tasks/activities, goals to achieve, and areas of continued difficulty. Pt exhibits good recall of information from last session, and admits to not working on abdominal breathing exercises. Wife was present during last few minutes of session to review discussion of high level executive function  deficits. Pt verbalizes awareness of deficits, but appears to hesitate to actually try some activities. 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      Assessment / Recommendations / Plan   Plan Continue with current plan of care     Progression Toward Goals   Progression toward goals Progressing toward goals          SLP Education - 06/03/17 1444    Education provided Yes   Education Details functional problem solving of areas needing to be addressed    Person(s) Educated Patient;Spouse   Methods Explanation;Demonstration;Verbal cues;Handout   Comprehension Verbalized understanding          SLP Short Term Goals - 06/03/17 1448      SLP SHORT TERM GOAL #1   Title Pt will demo abdominal breathing in 18/20 sentences over three sessions   Time 3   Period Weeks   Status On-going     SLP SHORT TERM GOAL #2   Title pt will demo emergent awareness (re: memory) in cognitive linguistic tasks with modified independence (nonverbal cues, careful listening, etc)   Time 3   Period Weeks   Status On-going     SLP SHORT TERM GOAL #3   Title pt will tell SLP 4 memory strategies over two sessions with modified independence   Baseline 8/24 =  association, making lists, calendar   Time 3   Period Weeks   Status On-going          SLP Long Term Goals - 06/03/17 1448      SLP LONG TERM GOAL #1   Title pt will use a memory compensation five times during therapy   Time 7   Period Weeks   Status On-going     SLP LONG TERM GOAL #2   Title pt will demo anticipatory awareness in 5 therapy sessions   Time 7   Period Weeks   Status On-going     SLP LONG TERM GOAL #3   Title pt will use abdominal breathing average 70% of the time in 10 minutes simple to mod complex conversation   Time 7   Period Weeks   Status  On-going          Plan - 06/03/17 1445    Clinical Impression Statement Pt exhibits mild executive function deficits including self monitoring, self correction, planning and goal setting. Session was spent discussing barriers to return to work and more independence. Continued ST is recommended to maximize cognitive linguistic skills for return to work and PLOF, and decreased caregiver burden,   Speech Therapy Frequency 2x / week   Duration --  8 weeks/17 visits   Treatment/Interventions Compensatory strategies;Patient/family education;Functional tasks;Cueing hierarchy;Cognitive reorganization;Compensatory techniques;Language facilitation;Internal/external aids;SLP instruction and feedback;Multimodal communcation approach   Potential to Achieve Goals Good   Potential Considerations Ability to learn/carryover information;Family/community support;Previous level of function;Cooperation/participation level   SLP Home Exercise Plan discussed   Consulted and Agree with Plan of Care Patient;Family member/caregiver   Family Member Consulted wife      Patient will benefit from skilled therapeutic intervention in order to improve the following deficits and impairments:   Cognitive communication deficit    Problem List There are no active problems to display for this patient.  Celia B. Quentin Ore, [facility], CCC-SLP Speech Pathologist  Shonna Chock 06/03/2017, 2:49 PM  [facility] [address] [place], [place], [address] Phone: [phone]   Fax:  [phone]   Name: Jeremiah Osborn MRN: [id number] Date of Birth: 03-20-1956

## 2017-06-03 NOTE — Patient Instructions (Signed)
  Identify what you want to be able to do  What you ARE able to do  What you can't do - and  why?   Physical? strength, endurance, balance, fear of falling?  Mental? Frustration? Patience? Initiation? Organization?  Fear of how much progress you will make?  Keep communication lines open - express your concerns!  Write things down   Ready to return to work? What do you need to be able to do function at work? What is in your way?  Volunteering!!

## 2017-06-06 ENCOUNTER — Ambulatory Visit: Payer: BLUE CROSS/BLUE SHIELD | Admitting: *Deleted

## 2017-06-06 ENCOUNTER — Ambulatory Visit: Payer: BLUE CROSS/BLUE SHIELD | Admitting: Physical Therapy

## 2017-06-06 ENCOUNTER — Encounter: Payer: Self-pay | Admitting: Physical Therapy

## 2017-06-06 DIAGNOSIS — R29818 Other symptoms and signs involving the nervous system: Secondary | ICD-10-CM

## 2017-06-06 DIAGNOSIS — R41841 Cognitive communication deficit: Secondary | ICD-10-CM

## 2017-06-06 DIAGNOSIS — R2689 Other abnormalities of gait and mobility: Secondary | ICD-10-CM | POA: Diagnosis not present

## 2017-06-06 DIAGNOSIS — R2681 Unsteadiness on feet: Secondary | ICD-10-CM

## 2017-06-06 NOTE — Patient Instructions (Signed)
Hamstring Stretch (Sitting)    Sitting, extend one leg (can place on stool or coffee table) Keep your back up straight. You should feel a stretch in back of thigh/knee. Hold _30___ seconds up to 30 minutes. Increase by 3-5 minutes at a time.    Advanced:  Keeping torso straight, lean forward, sliding hands down leg,  Copyright  VHI. All rights reserved.

## 2017-06-06 NOTE — Therapy (Signed)
[facility] [address] [place], [place], [address] Phone: [phone]   Fax:  [phone]  Speech Language Pathology Treatment  Patient Details  Name: Jeremiah Osborn MRN: [id number] Date of Birth: Jul 23, 1956 Referring Provider: Sharion Dove, MD  Encounter Date: 06/06/2017      End of Session - 06/06/17 1223    Visit Number 4   Number of Visits 17   Date for SLP Re-Evaluation 08/01/17   Authorization Type BCBS   Authorization - Visit Number 4   Authorization - Number of Visits 12   SLP Start Time 9381   SLP Stop Time  8299   SLP Time Calculation (min) 35 min   Activity Tolerance Patient tolerated treatment well      No past medical history on file.  No past surgical history on file.  There were no vitals filed for this visit.      Subjective Assessment - 06/06/17 1148    Patient is accompained by: Family member  wife   Currently in Pain? No/denies               ADULT SLP TREATMENT - 06/06/17 0001      General Information   Behavior/Cognition Alert;Cooperative;Pleasant mood     Treatment Provided   Treatment provided Cognitive-Linquistic     Pain Assessment   Pain Assessment No/denies pain     Cognitive-Linquistic Treatment   Treatment focused on Cognition;Patient/family/caregiver education   Skilled Treatment Skilled ST session focused on discussion of functional activities, multitasking, and use of compensatory strategies for recall. Pt reports completing Lumosity tasks several times a day, verbalizes sequencing needed for completion of painting task, and reports functional execution of daily activities (working on computer, paying bills, etc). Pt verbalizes awareness of physical limitations regarding increasing activities and returning to work, and indicates application of strategies for functional recall. Pt and wife were encouraged to discuss status with their son, to gain insight regarding  son's opinion on how pt is improving. Will complete the Cognitive Linguistic Quick Test (CLQT) next week. Anticipate DC from [facility] soon.     Assessment / Recommendations / Plan   Plan Continue with current plan of care     Progression Toward Goals   Progression toward goals Progressing toward goals          SLP Education - 06/06/17 1223    Education provided Yes   Education Details continued functional problem solving of activities at home   Person(s) Educated Patient;Spouse   Methods Explanation;Demonstration;Verbal cues   Comprehension Verbalized understanding          SLP Short Term Goals - 06/06/17 1226      SLP SHORT TERM GOAL #1   Title Pt will demo abdominal breathing in 18/20 sentences over three sessions   Baseline 06/06/17   Time 3   Period Weeks   Status On-going     SLP SHORT TERM GOAL #2   Title pt will demo emergent awareness (re: memory) in cognitive linguistic tasks with modified independence (nonverbal cues, careful listening, etc)   Status Achieved     SLP SHORT TERM GOAL #3   Title pt will tell SLP 4 memory strategies over two sessions with modified independence   Baseline 8/24 = association, making lists, calendar, 8/31 = aforementioned strategies, sticky notes, discussing with wife   Status Achieved          SLP Long Term Goals - 06/06/17 1228      SLP  LONG TERM GOAL #1   Title pt will use a memory compensation five times during therapy   Baseline pt reports daily use of compensatory strategies outside of therapy   Time 7   Period Weeks   Status On-going     SLP LONG TERM GOAL #2   Title pt will demo anticipatory awareness in 5 therapy sessions   Time 7   Period Weeks     SLP LONG TERM GOAL #3   Title pt will use abdominal breathing average 70% of the time in 10 minutes simple to mod complex conversation   Time 7   Period Weeks   Status On-going          Plan - 06/06/17 1224    Clinical Impression Statement Pt demonstrates  continued improvement in functional recall and reports increased independence at home with successful multitasking. Will complete CLQT next week. Continued short term ST is recommended to maximize cognitive linguistic skills for return to work and PLOF.    Speech Therapy Frequency 2x / week   Duration --  8 weeks   Treatment/Interventions Compensatory strategies;Patient/family education;Functional tasks;Cueing hierarchy;Cognitive reorganization;Compensatory techniques;Language facilitation;Internal/external aids;SLP instruction and feedback;Multimodal communcation approach   Potential to Achieve Goals Good   Potential Considerations Ability to learn/carryover information;Family/community support;Previous level of function;Cooperation/participation level   SLP Home Exercise Plan discussed   Consulted and Agree with Plan of Care Patient;Family member/caregiver   Family Member Consulted wife      Patient will benefit from skilled therapeutic intervention in order to improve the following deficits and impairments:   Cognitive communication deficit    Problem List There are no active problems to display for this patient.  Celia B. Quentin [facility], CCC-SLP Speech Language Pathologist  Shonna Chock 06/06/2017, 12:30 PM  [facility] [address] [place], [place], [address] Phone: [phone]   Fax:  [phone]   Name: ALONZO OWCZARZAK MRN: [id number] Date of Birth: [birth year]
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
## 2017-06-10 ENCOUNTER — Encounter: Payer: Self-pay | Admitting: Neurology

## 2017-06-10 ENCOUNTER — Telehealth: Payer: Self-pay | Admitting: Neurology

## 2017-06-10 ENCOUNTER — Ambulatory Visit: Payer: BLUE CROSS/BLUE SHIELD | Attending: Physical Medicine & Rehabilitation | Admitting: *Deleted

## 2017-06-10 ENCOUNTER — Ambulatory Visit (INDEPENDENT_AMBULATORY_CARE_PROVIDER_SITE_OTHER): Payer: BLUE CROSS/BLUE SHIELD | Admitting: Neurology

## 2017-06-10 VITALS — BP 127/84 | HR 93 | Ht 73.0 in | Wt 183.5 lb

## 2017-06-10 DIAGNOSIS — R2681 Unsteadiness on feet: Secondary | ICD-10-CM | POA: Insufficient documentation

## 2017-06-10 DIAGNOSIS — S069XAA Unspecified intracranial injury with loss of consciousness status unknown, initial encounter: Secondary | ICD-10-CM | POA: Insufficient documentation

## 2017-06-10 DIAGNOSIS — R41841 Cognitive communication deficit: Secondary | ICD-10-CM | POA: Diagnosis present

## 2017-06-10 DIAGNOSIS — S069X4S Unspecified intracranial injury with loss of consciousness of 6 hours to 24 hours, sequela: Secondary | ICD-10-CM | POA: Diagnosis not present

## 2017-06-10 DIAGNOSIS — R29818 Other symptoms and signs involving the nervous system: Secondary | ICD-10-CM | POA: Diagnosis present

## 2017-06-10 DIAGNOSIS — R2689 Other abnormalities of gait and mobility: Secondary | ICD-10-CM | POA: Insufficient documentation

## 2017-06-10 DIAGNOSIS — S069X9A Unspecified intracranial injury with loss of consciousness of unspecified duration, initial encounter: Secondary | ICD-10-CM | POA: Insufficient documentation

## 2017-06-10 DIAGNOSIS — R278 Other lack of coordination: Secondary | ICD-10-CM | POA: Diagnosis present

## 2017-06-10 NOTE — Telephone Encounter (Signed)
Get record from   [facility] in [place], [place] Address: [address], [place] [address]  MRI brain record, laboratory evaluations, discharge summaries only
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
## 2017-06-10 NOTE — Therapy (Signed)
[facility] [address] [place], [place], [address] Phone: [phone]   Fax:  [phone]  Speech Language Pathology Treatment  Patient Details  Name: NAGI FURIO MRN: [id number] Date of Birth: 03/29/1956 Referring Provider: Sharion Dove, MD  Encounter Date: 06/10/2017      End of Session - 06/10/17 1206    Visit Number 5   Number of Visits 17   Date for SLP Re-Evaluation 08/01/17   Authorization Type BCBS   Authorization - Visit Number 5   Authorization - Number of Visits 12   SLP Start Time 0131   SLP Stop Time  1200   SLP Time Calculation (min) 45 min   Activity Tolerance Patient tolerated treatment well      Past Medical History:  Diagnosis Date  . Depression with anxiety   . Hypertension   . Prostate cancer ([place])   . Traumatic brain injury [facility])     Past Surgical History:  Procedure Laterality Date  . CATARACT EXTRACTION    . PROSTATE SURGERY  2010   removed    There were no vitals filed for this visit.      Subjective Assessment - 06/10/17 1117    Subjective We'd never been to [place] before this weekend. It was nice   Currently in Pain? No/denies               ADULT SLP TREATMENT - 06/10/17 0001      General Information   Behavior/Cognition Alert;Cooperative;Pleasant mood     Treatment Provided   Treatment provided Cognitive-Linquistic     Pain Assessment   Pain Assessment No/denies pain     Cognitive-Linquistic Treatment   Treatment focused on Cognition;Patient/family/caregiver education   Skilled Treatment Skilled ST session focused on administration of CLQT to assess progress in treatment and identify areas of concern regarding high level cognitive functions. Scores on CLQT were all within functional limits. Pt/wife report return to baseline level of function and responsibility at home, and have no concerns in that area. Pt and wife were encouraged to notify MD if cognitive  difficulties are identified once he returns to work, as continued ST intervention is available if the need arises.      Assessment / Recommendations / Plan   Plan Discharge SLP treatment due to goals met     Progression Toward Goals   Progression toward goals Goals met, education completed, patient discharged from SLP          SLP Education - 06/10/17 1205    Education provided Yes   Education Details continue with higher level activities, return to therapy if needs arise once returned to work   [employer]) Educated Spouse;Patient   Methods Explanation;Demonstration;Verbal cues   Comprehension Verbalized understanding     SPEECH THERAPY DISCHARGE SUMMARY  Visits from Start of Care: 5  Current functional level related to goals / functional outcomes: Pt has met all goals related to cognition. Goals for abdominal breathing were discontinued.    Remaining deficits: None identified at this time. Pt was encouraged to return for skilled treatment if cognitive function difficulties arise once he returns to work.   Education / Equipment: Education completed on an ongoing basis with pt/wife.  Plan: Patient agrees to discharge.  Patient goals were met. Patient is being discharged due to meeting the stated rehab goals.  ?????           SLP Short Term Goals - 06/10/17 1208  SLP SHORT TERM GOAL #1   Title Pt will demo abdominal breathing in 18/20 sentences over three sessions   Status Deferred     SLP SHORT TERM GOAL #2   Title pt will demo emergent awareness (re: memory) in cognitive linguistic tasks with modified independence (nonverbal cues, careful listening, etc)   Status Achieved     SLP SHORT TERM GOAL #3   Title pt will tell SLP 4 memory strategies over two sessions with modified independence   Status Achieved          SLP Long Term Goals - 06/10/17 1209      SLP LONG TERM GOAL #1   Title pt will use a memory compensation five times during therapy   Status  Achieved     SLP LONG TERM GOAL #2   Title pt will demo anticipatory awareness in 5 therapy sessions   Status Achieved     SLP LONG TERM GOAL #3   Title pt will use abdominal breathing average 70% of the time in 10 minutes simple to mod complex conversation   Status Deferred          Plan - 06/10/17 1207    Clinical Impression Statement Administration of the Cognitive Linguistic Quick Test reveals performance within functional limits in all areas. Both patient and wife report comfort level with current functioning and independence. Pt was encouraged to return for additional therapy once he returns to work, if needs are identified. Goals are met. DC ST at this time.   Consulted and Agree with Plan of Care Patient;Family member/caregiver   Family Member Consulted wife      Patient will benefit from skilled therapeutic intervention in order to improve the following deficits and impairments:   Cognitive communication deficit    Problem List Patient Active Problem List   Diagnosis Date Noted  . TBI (traumatic brain injury) ([place]) 06/10/2017   Yacob Wilkerson B. [facility], CCC-SLP Speech Language Pathologist  Shonna Chock 06/10/2017, 12:10 PM  [facility] [address] [place], [place], [address] Phone: [phone]   Fax:  [phone]   Name: LIDO MASKE MRN: [id number] Date of Birth: 05/28/1956

## 2017-06-11 ENCOUNTER — Ambulatory Visit: Payer: BLUE CROSS/BLUE SHIELD | Admitting: Physical Therapy

## 2017-06-11 ENCOUNTER — Encounter: Payer: Self-pay | Admitting: Physical Therapy

## 2017-06-11 DIAGNOSIS — R2681 Unsteadiness on feet: Secondary | ICD-10-CM

## 2017-06-11 DIAGNOSIS — R2689 Other abnormalities of gait and mobility: Secondary | ICD-10-CM

## 2017-06-11 DIAGNOSIS — R41841 Cognitive communication deficit: Secondary | ICD-10-CM | POA: Diagnosis not present

## 2017-06-11 NOTE — Therapy (Signed)
[facility] [address] [place], [place], [address] Phone: [phone]   Fax:  [phone]  Physical Therapy Treatment  Patient Details  Name: Jeremiah Osborn MRN: [id number] Date of Birth: 1956-09-01 Referring Provider: Sharion Dove MD  Encounter Date: 06/11/2017      PT End of Session - 06/11/17 1220    Visit Number 4   Number of Visits 12  updated to 12 visit limit   Date for PT Re-Evaluation 07/08/17  12th visit   Authorization Type BCBS- 12 visit limit per discipline   Authorization - Visit Number 4   Authorization - Number of Visits 12  may apply for visits needed beyond 12   PT Start Time 1100   PT Stop Time 1150   PT Time Calculation (min) 50 min   Equipment Utilized During Treatment Gait belt   Activity Tolerance Patient tolerated treatment well   Behavior During Therapy [facility] for tasks assessed/performed      Past Medical History:  Diagnosis Date  . Depression with anxiety   . Hypertension   . Prostate cancer ([place])   . Traumatic brain injury [facility])     Past Surgical History:  Procedure Laterality Date  . CATARACT EXTRACTION    . PROSTATE SURGERY  2010   removed    There were no vitals filed for this visit.      Subjective Assessment - 06/11/17 1058    Subjective no new complaints. Reports exercises are going well.    Patient is accompained by: Family member   Patient Stated Goals Want to improve his walking; to not have to think so hard about initiating steps or when turning corners, curb, unlevel pavement; build endurance   Currently in Pain? No/denies                         [facility] Adult PT Treatment/Exercise - 06/11/17 1154      Transfers   Five time sit to stand comments  14.44 sec     Ambulation/Gait   Ambulation/Gait Assistance 5: Supervision   Ambulation/Gait Assistance Details vc for technique: incr step length, incr arm swing,    Ambulation Distance (Feet) 120  Feet  plus treadmill   Assistive device None   Gait Pattern Step-through pattern;Decreased arm swing - right;Decreased arm swing - left;Decreased step length - left;Decreased stance time - right   Ambulation Surface Level   Pre-Gait Activities walking through floor ladder (with flat rungs) stepping in every other opening; 10 reps with min assist due to imbalance with frequent stepping or catcihing foot on ladder     Exercises   Exercises Lumbar;Other Exercises   Other Exercises  supine towel roll under lumbar spine working on anterior pelvic tilt x 30 seconds, then rotate to posterior pelvic tilt x 5 seconds     Lumbar Exercises: Stretches   Lower Trunk Rotation 4 reps;30 seconds  with overpressure by PT   Pelvic Tilt 5 reps   Pelvic Tilt Limitations max vc and tactile cues     Lumbar Exercises: Machines for Strengthening   Tread Mill x 8.5 minutes up to 1.60mph, holding with bil UEs to side rails; continued decr step length and decr wt-shift over RLE     Lumbar Exercises: Seated   Other Seated Lumbar Exercises sitting on blue physioball attempting anterior and posterior pelvic tilt with max assist with very little mobility achieved and moved to supine on mat table  Knee/Hip Exercises: Stretches   Other Knee/Hip Stretches Rt knee with full extension after pt working on stretches since last visit             Balance Exercises - 06/11/17 1200      Balance Exercises: Standing   SLS Eyes open;Solid surface;Upper extremity support 1;5 reps  rt side to counter, on RLE with rt hand on counter            PT Education - 06/11/17 1218    Education provided Yes   Education Details see additions to HEP   Person(s) Educated Patient   Methods Explanation;Demonstration;Tactile cues;Verbal cues;Handout   Comprehension Verbalized understanding;Returned demonstration;Verbal cues required;Tactile cues required;Need further instruction          PT Short Term Goals - 05/31/17 1303       PT SHORT TERM GOAL #1   Title Patient will complete HEP under supervision of his wife (due to cognitive status). (TARGET for all STGs by 6th of 12 approved visits)   Status On-going     PT SHORT TERM GOAL #2   Title Patient will decrease time for 5x sit to stand to <13.5 seconds to demonstrate improved LE strength and balance.    Status On-going     PT SHORT TERM GOAL #3   Title Patient will improve gait velocity to >3.0 ft/sec with no LOB or staggering.    Status On-going     PT SHORT TERM GOAL #4   Title Patient will complete FGA to establish baseline score. Will improve score by 3 points by 6th visit.   Baseline 05/30/17: 22/30 baseline value established today   Status On-going           PT Long Term Goals - 05/27/17 2005      PT LONG TERM GOAL #1   Title Patient will improve 5x sit to stand time to <10 sec (approaching norm of 8.4 sec) demonstrating improved LE strength and overall balance. (TARGET for all LTGs by 12th visit)   Status New     PT LONG TERM GOAL #2   Title Patient will improve gait velocity to >=3.11 ft/sec (norm for his age group) on level surface without LOB.    Status New     PT LONG TERM GOAL #3   Title Patient will improve FGA score to >= 27/30 to demonstrate improved balance and lesser fall risk.    Status New     PT LONG TERM GOAL #4   Title Patient ambulating >1000 ft over unlevel terrain (grass, gravel, mulch, inclines/hill, declines, ramp, curb) with supervision only (no assist required for any LOB)   Status New     PT LONG TERM GOAL #5   Title Patient will verbalize plan for continued activity upon discharge from PT.    Status New               Plan - 06/11/17 1221    Clinical Impression Statement Session focused on pre-gait and gait training (including stretches to facilitate disassociation of upper trunk from lower trunk during gait). Patient progressing well towards goals.   Rehab Potential Good   PT Frequency 2x / week    PT Duration --  12 approved visits   PT Treatment/Interventions ADLs/Self Care Home Management;DME Instruction;Gait training;Stair training;Functional mobility training;Therapeutic activities;Therapeutic exercise;Balance training;Neuromuscular re-education;Cognitive remediation;Patient/family education   PT Next Visit Plan Check HEP given 9/5; continue gait training with incr stance time on RLE, incr arm swing and trunk rotation;  continue to work on high level dynamic balance, balance on compliant surfaces and balance with vision removed. add to HEP as needed.    Consulted and Agree with Plan of Care Patient      Patient will benefit from skilled therapeutic intervention in order to improve the following deficits and impairments:  Abnormal gait, Decreased activity tolerance, Decreased balance, Decreased cognition, Decreased coordination, Decreased safety awareness, Decreased strength, Impaired perceived functional ability  Visit Diagnosis: Other abnormalities of gait and mobility  Unsteadiness on feet     Problem List Patient Active Problem List   Diagnosis Date Noted  . TBI (traumatic brain injury) ([place]) 06/10/2017    Rexanne Mano, PT 06/11/2017, 12:25 PM  [facility] [address] [place], [place], [address] Phone: [phone]   Fax:  [phone]  Name: Jeremiah Osborn MRN: [id number] Date of Birth: 1956/09/16

## 2017-06-11 NOTE — Patient Instructions (Signed)
Anterior Pelvic Tilt (Supine)    Lie on back with knees bent. Place rolled towel/blancket (~6 inches in diameter) underneath your lower back. Arch back (trying to reach your hips to the floor) and Hold _30+___ seconds. Then "tuck your tail under" and lift your hips from the floor. Hold 5 seconds. Repeat __5__ times. Do __2__ sessions per day. * Can lie for longer with roll under your back working on arching.   http://gt2.exer.us/683   Copyright  VHI. All rights reserved.     SINGLE LIMB STANCE    Stance: right leg on floor. Raise left leg. Use rt hand on counter for support as needed to Hold __10_ seconds.  __5_ reps per set, __2_ sets per day, __5_ days per week  Copyright  VHI. All rights reserved.  *Try to increase your time on the treadmill by 1 minute every couple of days until you reach 30 minutes. Work on long steps, heel touches first, and pushing off big toe last.

## 2017-06-12 ENCOUNTER — Other Ambulatory Visit: Payer: BLUE CROSS/BLUE SHIELD

## 2017-06-13 ENCOUNTER — Ambulatory Visit: Payer: BLUE CROSS/BLUE SHIELD | Admitting: Physical Therapy

## 2017-06-13 ENCOUNTER — Encounter: Payer: Self-pay | Admitting: Physical Therapy

## 2017-06-13 ENCOUNTER — Encounter: Payer: BLUE CROSS/BLUE SHIELD | Admitting: *Deleted

## 2017-06-13 DIAGNOSIS — R41841 Cognitive communication deficit: Secondary | ICD-10-CM | POA: Diagnosis not present

## 2017-06-13 DIAGNOSIS — R2689 Other abnormalities of gait and mobility: Secondary | ICD-10-CM

## 2017-06-13 DIAGNOSIS — R2681 Unsteadiness on feet: Secondary | ICD-10-CM

## 2017-06-13 NOTE — Therapy (Signed)
[facility] [address] [place], [place], [address] Phone: [phone]   Fax:  [phone]  Physical Therapy Treatment  Patient Details  Name: Jeremiah Osborn MRN: [id number] Date of Birth: Aug 11, 1956 Referring Provider: Sharion Dove MD  Encounter Date: 06/13/2017      PT End of Session - 06/13/17 1251    Visit Number 5   Number of Visits 12  updated to 12 visit limit   Date for PT Re-Evaluation 07/08/17  12th visit   Authorization Type BCBS- 12 visit limit per discipline   Authorization - Visit Number 5   Authorization - Number of Visits 12  may apply for visits needed beyond 12   PT Start Time 0803   PT Stop Time 0844   PT Time Calculation (min) 41 min   Equipment Utilized During Treatment Gait belt   Activity Tolerance Patient tolerated treatment well   Behavior During Therapy [facility] for tasks assessed/performed      Past Medical History:  Diagnosis Date  . Depression with anxiety   . Hypertension   . Prostate cancer ([place])   . Traumatic brain injury [facility])     Past Surgical History:  Procedure Laterality Date  . CATARACT EXTRACTION    . PROSTATE SURGERY  2010   removed    There were no vitals filed for this visit.      Subjective Assessment - 06/13/17 0805    Subjective no new complaints. reports no soreness after last session   Patient is accompained by: Family member   Patient Stated Goals Want to improve his walking; to not have to think so hard about initiating steps or when turning corners, curb, unlevel pavement; build endurance   Currently in Pain? No/denies                         [facility] Adult PT Treatment/Exercise - 06/13/17 0833      Transfers   Transfers Floor to Transfer   Floor to Transfer 6: Modified independent (Device/Increase time);With upper extremity assist  to red mat without use of chair or support     Ambulation/Gait   Ambulation/Gait Assistance 4: Min  assist   Ambulation/Gait Assistance Details vc and assist for rt wtshift   Ambulation Distance (Feet) 120 Feet  x3; plus treadmill   Assistive device None   Gait Pattern Step-through pattern;Decreased arm swing - right;Decreased arm swing - left;Decreased step length - left;Decreased stance time - right   Ambulation Surface Level   Pre-Gait Activities stepping forward onto 3" step with heel, then step back and shift back onto back leg, repeat; bil x 20     Exercises   Other Exercises  supine towel roll under lumbar spine working on anterior pelvic tilt x 30 seconds, then rotate to posterior pelvic tilt x 5 seconds;  tall kneeling to 1/2 kneeling each leg x 10 reps with light single UE support;  tall kneeling side stepping rt and lt x 10 ft     Lumbar Exercises: Quadruped   Opposite Arm/Leg Raise Right arm/Left leg;Left arm/Right leg;10 reps;3 seconds   Opposite Arm/Leg Raise Limitations unable to fully extend either LE (hip and knee) with apparent core weakness and ddecr balance                PT Education - 06/13/17 1249    Education provided Yes   Education Details see additions to HEp   Person(s) Educated Patient  Methods Explanation;Demonstration;Verbal cues;Handout;Tactile cues   Comprehension Verbalized understanding;Returned demonstration;Verbal cues required;Tactile cues required;Need further instruction          PT Short Term Goals - 05/31/17 1303      PT SHORT TERM GOAL #1   Title Patient will complete HEP under supervision of his wife (due to cognitive status). (TARGET for all STGs by 6th of 12 approved visits)   Status On-going     PT SHORT TERM GOAL #2   Title Patient will decrease time for 5x sit to stand to <13.5 seconds to demonstrate improved LE strength and balance.    Status On-going     PT SHORT TERM GOAL #3   Title Patient will improve gait velocity to >3.0 ft/sec with no LOB or staggering.    Status On-going     PT SHORT TERM GOAL #4    Title Patient will complete FGA to establish baseline score. Will improve score by 3 points by 6th visit.   Baseline 05/30/17: 22/30 baseline value established today   Status On-going           PT Long Term Goals - 05/27/17 2005      PT LONG TERM GOAL #1   Title Patient will improve 5x sit to stand time to <10 sec (approaching norm of 8.4 sec) demonstrating improved LE strength and overall balance. (TARGET for all LTGs by 12th visit)   Status New     PT LONG TERM GOAL #2   Title Patient will improve gait velocity to >=3.11 ft/sec (norm for his age group) on level surface without LOB.    Status New     PT LONG TERM GOAL #3   Title Patient will improve FGA score to >= 27/30 to demonstrate improved balance and lesser fall risk.    Status New     PT LONG TERM GOAL #4   Title Patient ambulating >1000 ft over unlevel terrain (grass, gravel, mulch, inclines/hill, declines, ramp, curb) with supervision only (no assist required for any LOB)   Status New     PT LONG TERM GOAL #5   Title Patient will verbalize plan for continued activity upon discharge from PT.    Status New               Plan - 06/13/17 1253    Clinical Impression Statement Session focused on pre-gait, gait, and strength training with focus on incr step-length and rt weight-shift over RLE. Pt progressing towards goals.    Rehab Potential Good   PT Frequency 2x / week   PT Duration --  12 approved visits   PT Treatment/Interventions ADLs/Self Care Home Management;DME Instruction;Gait training;Stair training;Functional mobility training;Therapeutic activities;Therapeutic exercise;Balance training;Neuromuscular re-education;Cognitive remediation;Patient/family education   PT Next Visit Plan Check STGs; continue pre-gait and gait training with incr stance time on RLE, incr arm swing and trunk rotation; continue to work on high level dynamic balance, balance on compliant surfaces and balance with vision removed. add to  HEP as needed.    Consulted and Agree with Plan of Care Patient      Patient will benefit from skilled therapeutic intervention in order to improve the following deficits and impairments:  Abnormal gait, Decreased activity tolerance, Decreased balance, Decreased cognition, Decreased coordination, Decreased safety awareness, Decreased strength, Impaired perceived functional ability  Visit Diagnosis: Other abnormalities of gait and mobility  Unsteadiness on feet     Problem List Patient Active Problem List   Diagnosis Date Noted  . TBI (traumatic  brain injury) ([place]) 06/10/2017    Rexanne Mano, PT 06/13/2017, 9:11 PM  [facility] [address] [place], [place], [address] Phone: [phone]   Fax:  [phone]  Name: Jeremiah Osborn MRN: [id number] Date of Birth: Apr 28, 1956

## 2017-06-13 NOTE — Patient Instructions (Signed)
DEVELOPMENTAL POSITION: Tall Kneeling to Half Kneeling    From tall kneeling position, shift weight to one side. Bring opposite leg forward, place foot flat on surface. __10_ reps per leg, __1_ sets per day, __5_ days per week Repeat with other leg.  Copyright  VHI. All rights reserved.   Arm / Leg Extension: Alternate (All-Fours)    Raise right arm and opposite leg. Do not arch neck. Keep your hips level (like a table top). Count to 5.  Repeat __20__ times per set. Do __1__ sets per session. Do __5__ days per week.   http://orth.exer.us/110   Copyright  VHI. All rights reserved.

## 2017-06-17 ENCOUNTER — Encounter: Payer: Self-pay | Admitting: Physical Therapy

## 2017-06-17 ENCOUNTER — Encounter: Payer: BLUE CROSS/BLUE SHIELD | Admitting: *Deleted

## 2017-06-17 ENCOUNTER — Ambulatory Visit: Payer: BLUE CROSS/BLUE SHIELD | Admitting: Physical Therapy

## 2017-06-17 DIAGNOSIS — R2681 Unsteadiness on feet: Secondary | ICD-10-CM

## 2017-06-17 DIAGNOSIS — R41841 Cognitive communication deficit: Secondary | ICD-10-CM | POA: Diagnosis not present

## 2017-06-17 DIAGNOSIS — R2689 Other abnormalities of gait and mobility: Secondary | ICD-10-CM

## 2017-06-17 NOTE — Therapy (Signed)
[facility] [address] [place], [place], [address] Phone: [phone]   Fax:  [phone]  Physical Therapy Treatment  Patient Details  Name: RIORDAN WALLE MRN: [id number] Date of Birth: October 15, 1955 Referring Provider: Sharion Dove MD  Encounter Date: 06/17/2017      PT End of Session - 06/17/17 0935    Visit Number 6   Number of Visits 12  updated to 12 visit limit   Date for PT Re-Evaluation 07/08/17  12th visit   Authorization Type [employer]- 12 visit limit per discipline   Authorization - Visit Number 6   Authorization - Number of Visits 12  may apply for visits needed beyond 12   PT Start Time 0935   PT Stop Time 1015   PT Time Calculation (min) 40 min   Equipment Utilized During Treatment Gait belt   Activity Tolerance Patient tolerated treatment well   Behavior During Therapy [facility] for tasks assessed/performed      Past Medical History:  Diagnosis Date  . Depression with anxiety   . Hypertension   . Prostate cancer ([place])   . Traumatic brain injury [facility])     Past Surgical History:  Procedure Laterality Date  . CATARACT EXTRACTION    . PROSTATE SURGERY  2010   removed    There were no vitals filed for this visit.      Subjective Assessment - 06/17/17 0935    Subjective No new complaints. No falls or pain to report. HEP is going well, still challenging.    Patient is accompained by: Family member  spouse in lobby   Patient Stated Goals Want to improve his walking; to not have to think so hard about initiating steps or when turning corners, curb, unlevel pavement; build endurance   Currently in Pain? No/denies            [facility] PT Assessment - 06/17/17 0937      Transfers   Transfers Sit to Stand;Stand to Sit   Sit to Stand 6: Modified independent (Device/Increase time)   Five time sit to stand comments  13.07 sec's with arms across chest from standard height chair    Stand to Sit 6:  Modified independent (Device/Increase time)     Ambulation/Gait   Ambulation/Gait Yes   Ambulation/Gait Assistance 5: Supervision   Ambulation/Gait Assistance Details cues for weight shifting, step length and arm swing   Ambulation Distance (Feet) 210 Feet   Assistive device None   Gait Pattern Step-through pattern;Decreased arm swing - right;Decreased arm swing - left;Decreased step length - left;Decreased stance time - right   Ambulation Surface Level;Indoor   Gait velocity 11.66 sec's= 2.81 ft/sec     Functional Gait  Assessment   Gait assessed  Yes   Gait Level Surface Walks 20 ft in less than 7 sec but greater than 5.5 sec, uses assistive device, slower speed, mild gait deviations, or deviates 6-10 in outside of the 12 in walkway width.  6.23 sec's   Change in Gait Speed Able to smoothly change walking speed without loss of balance or gait deviation. Deviate no more than 6 in outside of the 12 in walkway width.   Gait with Horizontal Head Turns Performs head turns smoothly with no change in gait. Deviates no more than 6 in outside 12 in walkway width   Gait with Vertical Head Turns Performs head turns with no change in gait. Deviates no more than 6 in outside 12 in walkway width.  Gait and Pivot Turn Pivot turns safely within 3 sec and stops quickly with no loss of balance.   Step Over Obstacle Is able to step over one shoe box (4.5 in total height) without changing gait speed. No evidence of imbalance.   Gait with Narrow Base of Support Ambulates less than 4 steps heel to toe or cannot perform without assistance.   Gait with Eyes Closed Walks 20 ft, uses assistive device, slower speed, mild gait deviations, deviates 6-10 in outside 12 in walkway width. Ambulates 20 ft in less than 9 sec but greater than 7 sec.   Ambulating Backwards Walks 20 ft, no assistive devices, good speed, no evidence for imbalance, normal gait   Steps Alternating feet, no rail.   Total Score 24              Balance Exercises - 06/17/17 1006      Balance Exercises: Standing   SLS with Vectors Foam/compliant surface;Other reps (comment);Limitations     Balance Exercises: Standing   SLS with Vectors Limitations 2 tall cones on red mat: fwd toe taps, progressing to cross toe taps x 10 reps each bil legs with min guard assist to min assist with cues to slow down, for weight shifting and base of support to assist with balance. progressed to fwd double toe taps, then cross double toe taps x 8-10 each bil legs with min to mod assist. cues as before.               PT Short Term Goals - 06/17/17 0936      PT SHORT TERM GOAL #1   Title Patient will complete HEP under supervision of his wife (due to cognitive status). (TARGET for all STGs by 6th of 12 approved visits)   Baseline 06/07/17: met today   Status Achieved     PT SHORT TERM GOAL #2   Title Patient will decrease time for 5x sit to stand to <13.5 seconds to demonstrate improved LE strength and balance.    Baseline 06/17/17: 13.07 sec's   Status Achieved     PT SHORT TERM GOAL #3   Title Patient will improve gait velocity to >3.0 ft/sec with no LOB or staggering.    Baseline 06/17/17: no change in gait speed on retest today (2.81 ft/sec today vs 2.88 on eval), however no staggering or LOB noted    Status Partially Met     PT SHORT TERM GOAL #4   Title Patient will complete FGA to establish baseline score. Will improve score by 3 points by 6th visit.   Baseline 06/17/17: increased by 2 points on recheck today (24/30 today vs 22/30 at baseline). improved just not to goal.    Status Partially Met           PT Long Term Goals - 05/27/17 2005      PT LONG TERM GOAL #1   Title Patient will improve 5x sit to stand time to <10 sec (approaching norm of 8.4 sec) demonstrating improved LE strength and overall balance. (TARGET for all LTGs by 12th visit)   Status New     PT LONG TERM GOAL #2   Title Patient will improve gait velocity to  >=3.11 ft/sec (norm for his age group) on level surface without LOB.    Status New     PT LONG TERM GOAL #3   Title Patient will improve FGA score to >= 27/30 to demonstrate improved balance and lesser fall risk.  Status New     PT LONG TERM GOAL #4   Title Patient ambulating >1000 ft over unlevel terrain (grass, gravel, mulch, inclines/hill, declines, ramp, curb) with supervision only (no assist required for any LOB)   Status New     PT LONG TERM GOAL #5   Title Patient will verbalize plan for continued activity upon discharge from PT.    Status New             Plan - 06/17/17 0936    Clinical Impression Statement today's skilled session focused on checking progress toward STGs with 2/4 met and 2/4 partially met. Remainder of session addressed single leg stance balance with emphasis on right weight shifiting and equal stance time each leg. Pt is progressing toward goals and should benefit from continued PT to progress toward unmet goals.    Rehab Potential Good   PT Frequency 2x / week   PT Duration --  12 approved visits   PT Treatment/Interventions ADLs/Self Care Home Management;DME Instruction;Gait training;Stair training;Functional mobility training;Therapeutic activities;Therapeutic exercise;Balance training;Neuromuscular re-education;Cognitive remediation;Patient/family education   PT Next Visit Plan continue pre-gait and gait training with incr stance time on RLE,, ?treadmill with gait trainer program,  incr arm swing and trunk rotation; continue to work on high level dynamic balance, balance on compliant surfaces and balance with vision removed. add to HEP as needed.    Consulted and Agree with Plan of Care Patient      Patient will benefit from skilled therapeutic intervention in order to improve the following deficits and impairments:  Abnormal gait, Decreased activity tolerance, Decreased balance, Decreased cognition, Decreased coordination, Decreased safety awareness,  Decreased strength, Impaired perceived functional ability  Visit Diagnosis: Other abnormalities of gait and mobility  Unsteadiness on feet     Problem List Patient Active Problem List   Diagnosis Date Noted  . TBI (traumatic brain injury) ([place]) 06/10/2017    Willow Ora, PTA, [place] [facility] [address], [place], [place] [address] [phone] 06/17/17, 11:37 AM   Name: MICHIO THIER MRN: [id number] Date of Birth: [birth year]

## 2017-06-19 ENCOUNTER — Encounter: Payer: Self-pay | Admitting: Physical Therapy

## 2017-06-19 ENCOUNTER — Ambulatory Visit: Payer: BLUE CROSS/BLUE SHIELD | Admitting: Physical Therapy

## 2017-06-19 DIAGNOSIS — R2689 Other abnormalities of gait and mobility: Secondary | ICD-10-CM

## 2017-06-19 DIAGNOSIS — R41841 Cognitive communication deficit: Secondary | ICD-10-CM | POA: Diagnosis not present

## 2017-06-19 DIAGNOSIS — R2681 Unsteadiness on feet: Secondary | ICD-10-CM

## 2017-06-21 NOTE — Therapy (Signed)
[facility] [address] [place], [place], [address] Phone: [phone]   Fax:  [phone]  Physical Therapy Treatment  Patient Details  Name: Jeremiah Osborn MRN: [id number] Date of Birth: [birth year] Referring Provider: Sharion Dove MD  Encounter Date: 06/19/2017   06/19/17 1324  PT Visits / Re-Eval  Visit Number 7  Number of Visits 12 (updated to 12 visit limit)  Date for PT Re-Evaluation 07/08/17 (12th visit)  [place]- 12 visit limit per discipline  Authorization - Visit Number 7  Authorization - Number of Visits 12 (may apply for visits needed beyond 12)  PT Time Calculation  PT Start Time 1320  PT Stop Time 1400  PT Time Calculation (min) 40 min  PT - End of Session  Equipment Utilized During Treatment Gait belt  Activity Tolerance Patient tolerated treatment well  Behavior During Therapy [facility] for tasks assessed/performed     Past Medical History:  Diagnosis Date  . Depression with anxiety   . Hypertension   . Prostate cancer ([place])   . Traumatic brain injury [facility])     Past Surgical History:  Procedure Laterality Date  . CATARACT EXTRACTION    . PROSTATE SURGERY  2010   removed    There were no vitals filed for this visit.     06/19/17 1323  Symptoms/Limitations  Subjective No new complaints. No falls or pain to report. Tired today due to trouble sleeping last night.  Patient is accompained by: Family member (spouse in lobby)  Patient Stated Goals Want to improve his walking; to not have to think so hard about initiating steps or when turning corners, curb, unlevel pavement; build endurance  Pain Assessment  Currently in Pain? No/denies      06/19/17 1324  Transfers  Transfers Sit to Stand;Stand to Sit  Sit to Stand 6: Modified independent (Device/Increase time)  Stand to Sit 6: Modified independent (Device/Increase time)  Ambulation/Gait  Ambulation/Gait  Yes  Ambulation/Gait Assistance 5: Supervision  Ambulation/Gait Assistance Details min cues for equal step length and reciprocal arm swing (right > left). l  Ambulation Distance (Feet) 450 Feet (x1)  Assistive device None  Gait Pattern Step-through pattern;Decreased stride length;Poor foot clearance - right  Ambulation Surface Level;Indoor  Pre-Gait Activities Gait trainer program on treadmill x 6 minutes with avg speed 1.4 mph, results saved in computer for comparrison at next time it's performed.      06/19/17 1344  Balance Exercises: Standing  Rockerboard Anterior/posterior;Lateral;Head turns;EO;EC;30 seconds;10 reps  Balance Beam standing across blue foam beam: alternating fwd heel taps to floor and back onto beam, then alternating backward toe taps to floor and back onto beam. 10 reps each bil legs with min to mod assist for balance. cues on posture and weight shifting.   Balance Exercises: Standing  Rebounder Limitations performed both ways on balance board with no UE support: EO rocking board with emphasis on tall posture; holding board steady: EC no head movements, progressing to EC with head movements left<>right and up<>down. min to mod assist for balance with cues on posture/weight shifting for balance.           PT Short Term Goals - 06/17/17 0936      PT SHORT TERM GOAL #1   Title Patient will complete HEP under supervision of his wife (due to cognitive status). (TARGET for all STGs by 6th of 12 approved visits)   Baseline 06/07/17: met today   Status Achieved  PT SHORT TERM GOAL #2   Title Patient will decrease time for 5x sit to stand to <13.5 seconds to demonstrate improved LE strength and balance.    Baseline 06/17/17: 13.07 sec's   Status Achieved     PT SHORT TERM GOAL #3   Title Patient will improve gait velocity to >3.0 ft/sec with no LOB or staggering.    Baseline 06/17/17: no change in gait speed on retest today (2.81 ft/sec today vs 2.88 on eval), however  no staggering or LOB noted    Status Partially Met     PT SHORT TERM GOAL #4   Title Patient will complete FGA to establish baseline score. Will improve score by 3 points by 6th visit.   Baseline 06/17/17: increased by 2 points on recheck today (24/30 today vs 22/30 at baseline). improved just not to goal.    Status Partially Met           PT Long Term Goals - 05/27/17 2005      PT LONG TERM GOAL #1   Title Patient will improve 5x sit to stand time to <10 sec (approaching norm of 8.4 sec) demonstrating improved LE strength and overall balance. (TARGET for all LTGs by 12th visit)   Status New     PT LONG TERM GOAL #2   Title Patient will improve gait velocity to >=3.11 ft/sec (norm for his age group) on level surface without LOB.    Status New     PT LONG TERM GOAL #3   Title Patient will improve FGA score to >= 27/30 to demonstrate improved balance and lesser fall risk.    Status New     PT LONG TERM GOAL #4   Title Patient ambulating >1000 ft over unlevel terrain (grass, gravel, mulch, inclines/hill, declines, ramp, curb) with supervision only (no assist required for any LOB)   Status New     PT LONG TERM GOAL #5   Title Patient will verbalize plan for continued activity upon discharge from PT.    Status New        06/19/17 1324  Plan  Clinical Impression Statement Today's skilled session continued to address gait with emphasis on incr step/stride length and equal LE stance time. Used gait trainer program for visual feedback to address these issues as well with good carryover noted with gait afterwards. Remainder of session addressed balance reactions with no issues reported. Pt is progressing toward goals and should benefit from continued PT to progress toward unmet goals.   Pt will benefit from skilled therapeutic intervention in order to improve on the following deficits Abnormal gait;Decreased activity tolerance;Decreased balance;Decreased cognition;Decreased  coordination;Decreased safety awareness;Decreased strength;Impaired perceived functional ability  Rehab Potential Good  PT Frequency 2x / week  PT Duration (12 approved visits)  PT Treatment/Interventions ADLs/Self Care Home Management;DME Instruction;Gait training;Stair training;Functional mobility training;Therapeutic activities;Therapeutic exercise;Balance training;Neuromuscular re-education;Cognitive remediation;Patient/family education  PT Next Visit Plan continue pre-gait and gait training with incr stance time on RLE,  incr arm swing and trunk rotation; continue to work on high level dynamic balance, balance on compliant surfaces and balance with vision removed. add to HEP as needed.   Consulted and Agree with Plan of Care Patient            Patient will benefit from skilled therapeutic intervention in order to improve the following deficits and impairments:  Abnormal gait, Decreased activity tolerance, Decreased balance, Decreased cognition, Decreased coordination, Decreased safety awareness, Decreased strength, Impaired perceived functional ability  Visit Diagnosis: Other abnormalities of gait and mobility  Unsteadiness on feet     Problem List Patient Active Problem List   Diagnosis Date Noted  . TBI (traumatic brain injury) ([place]) 06/10/2017    Willow Ora, PTA, [facility] [address], [place],  [address] [phone] 06/21/17, 5:45 PM   Name: BREANNA MCDANIEL MRN: [id number] Date of Birth: 06/15/1956

## 2017-06-24 ENCOUNTER — Encounter: Payer: BLUE CROSS/BLUE SHIELD | Admitting: *Deleted

## 2017-06-24 ENCOUNTER — Ambulatory Visit: Payer: BLUE CROSS/BLUE SHIELD | Admitting: Physical Therapy

## 2017-06-24 ENCOUNTER — Encounter: Payer: Self-pay | Admitting: Physical Therapy

## 2017-06-24 DIAGNOSIS — R2681 Unsteadiness on feet: Secondary | ICD-10-CM

## 2017-06-24 DIAGNOSIS — R41841 Cognitive communication deficit: Secondary | ICD-10-CM | POA: Diagnosis not present

## 2017-06-24 DIAGNOSIS — R2689 Other abnormalities of gait and mobility: Secondary | ICD-10-CM

## 2017-06-25 NOTE — Therapy (Signed)
[facility] [address] [place], [place], [address] Phone: [phone]   Fax:  [phone]  Physical Therapy Treatment  Patient Details  Name: Jeremiah Osborn MRN: [id number] Date of Birth: July 14, 1956 Referring Provider: Sharion Dove MD  Encounter Date: 06/24/2017   06/24/17 1236  PT Visits / Re-Eval  Visit Number 8  Number of Visits 12 (updated to 12 visit limit)  Date for PT Re-Evaluation 07/08/17 (12th visit)  [place]- 12 visit limit per discipline  Authorization - Visit Number 8  Authorization - Number of Visits 12 (may apply for visits needed beyond 12)  PT Time Calculation  PT Start Time 1233  PT Stop Time 1315  PT Time Calculation (min) 42 min  PT - End of Session  Equipment Utilized During Treatment Gait belt  Activity Tolerance Patient tolerated treatment well  Behavior During Therapy [facility] for tasks assessed/performed     Past Medical History:  Diagnosis Date  . Depression with anxiety   . Hypertension   . Prostate cancer ([place])   . Traumatic brain injury [facility])     Past Surgical History:  Procedure Laterality Date  . CATARACT EXTRACTION    . PROSTATE SURGERY  2010   removed    There were no vitals filed for this visit.   06/24/17 1235  Symptoms/Limitations  Subjective No new complaints. No falls or pain to report.   Patient is accompained by: Family member (spouse in lobby)  Patient Stated Goals Want to improve his walking; to not have to think so hard about initiating steps or when turning corners, curb, unlevel pavement; build endurance  Pain Assessment  Currently in Pain? No/denies      06/24/17 1236  Transfers  Transfers Sit to Stand;Stand to Sit  Sit to Stand 6: Modified independent (Device/Increase time)  Stand to Sit 6: Modified independent (Device/Increase time)  Ambulation/Gait  Ambulation/Gait Yes  Ambulation/Gait Assistance 5: Supervision   Ambulation/Gait Assistance Details no balance loss noted, occasional toe scuffing heard.   Ambulation Distance (Feet) 1000 Feet  Assistive device None  Gait Pattern Step-through pattern;Decreased stride length;Poor foot clearance - right  Ambulation Surface Level;Unlevel;Indoor;Outdoor;Paved;Gravel;Grass  High Level Balance  High Level Balance Activities Marching forwards;Marching backwards;Tandem walking (tandem/heel/toe all fwd/bwd)  High Level Balance Comments on both red mats next to counter for balance assist as needed: 3 laps each with min guard assist to min assist for balance with cues on posture, ex form/technique and weight shifitng to assist with balance.                        06/24/17 1309  Balance Exercises: Standing  SLS with Vectors Foam/compliant surface;Other reps (comment);Limitations  Balance Exercises: Standing  SLS with Vectors Limitations 6 cones along  edges of both red mats: alternating toe taps to each cone with side stepping left<>right x 1 lap each way, then alternating double toe taps to each cone with side stepping left<>right x 1 rep each way. no UE support with min guard to min assist for balance.            PT Short Term Goals - 06/17/17 0936      PT SHORT TERM GOAL #1   Title Patient will complete HEP under supervision of his wife (due to cognitive status). (TARGET for all STGs by 6th of 12 approved visits)   Baseline 06/07/17: met today   Status Achieved     PT  SHORT TERM GOAL #2   Title Patient will decrease time for 5x sit to stand to <13.5 seconds to demonstrate improved LE strength and balance.    Baseline 06/17/17: 13.07 sec's   Status Achieved     PT SHORT TERM GOAL #3   Title Patient will improve gait velocity to >3.0 ft/sec with no LOB or staggering.    Baseline 06/17/17: no change in gait speed on retest today (2.81 ft/sec today vs 2.88 on eval), however no staggering or LOB noted    Status Partially Met     PT SHORT TERM GOAL #4    Title Patient will complete FGA to establish baseline score. Will improve score by 3 points by 6th visit.   Baseline 06/17/17: increased by 2 points on recheck today (24/30 today vs 22/30 at baseline). improved just not to goal.    Status Partially Met           PT Long Term Goals - 05/27/17 2005      PT LONG TERM GOAL #1   Title Patient will improve 5x sit to stand time to <10 sec (approaching norm of 8.4 sec) demonstrating improved LE strength and overall balance. (TARGET for all LTGs by 12th visit)   Status New     PT LONG TERM GOAL #2   Title Patient will improve gait velocity to >=3.11 ft/sec (norm for his age group) on level surface without LOB.    Status New     PT LONG TERM GOAL #3   Title Patient will improve FGA score to >= 27/30 to demonstrate improved balance and lesser fall risk.    Status New     PT LONG TERM GOAL #4   Title Patient ambulating >1000 ft over unlevel terrain (grass, gravel, mulch, inclines/hill, declines, ramp, curb) with supervision only (no assist required for any LOB)   Status New     PT LONG TERM GOAL #5   Title Patient will verbalize plan for continued activity upon discharge from PT.    Status New        06/24/17 1235  Plan  Clinical Impression Statement Today's skilled session focused on gait with emphasis on activity tolerance and balance activities with no issues reported. Pt is progressing toward goals and should benefit from contineud PT to progress toward unmet goals.   Pt will benefit from skilled therapeutic intervention in order to improve on the following deficits Abnormal gait;Decreased activity tolerance;Decreased balance;Decreased cognition;Decreased coordination;Decreased safety awareness;Decreased strength;Impaired perceived functional ability  Rehab Potential Good  PT Frequency 2x / week  PT Duration (12 approved visits)  PT Treatment/Interventions ADLs/Self Care Home Management;DME Instruction;Gait training;Stair  training;Functional mobility training;Therapeutic activities;Therapeutic exercise;Balance training;Neuromuscular re-education;Cognitive remediation;Patient/family education  PT Next Visit Plan continue pre-gait and gait training with incr stance time on RLE,  incr arm swing and trunk rotation; continue to work on high level dynamic balance, balance on compliant surfaces and balance with vision removed. add to HEP as needed.   Consulted and Agree with Plan of Care Patient       Patient will benefit from skilled therapeutic intervention in order to improve the following deficits and impairments:  Abnormal gait, Decreased activity tolerance, Decreased balance, Decreased cognition, Decreased coordination, Decreased safety awareness, Decreased strength, Impaired perceived functional ability  Visit Diagnosis: Other abnormalities of gait and mobility  Unsteadiness on feet     Problem List Patient Active Problem List   Diagnosis Date Noted  . TBI (traumatic brain injury) ([place]) 06/10/2017  Willow Ora, PTA, [facility] [address] [place], [place] [address] [phone] 06/25/17, 9:31 PM   Name: Jeremiah Osborn MRN: [id number] Date of Birth: October 08, 1955

## 2017-06-26 ENCOUNTER — Ambulatory Visit: Payer: BLUE CROSS/BLUE SHIELD | Admitting: Physical Therapy

## 2017-06-26 ENCOUNTER — Encounter: Payer: Self-pay | Admitting: Physical Therapy

## 2017-06-26 DIAGNOSIS — R278 Other lack of coordination: Secondary | ICD-10-CM

## 2017-06-26 DIAGNOSIS — R2681 Unsteadiness on feet: Secondary | ICD-10-CM

## 2017-06-26 DIAGNOSIS — R41841 Cognitive communication deficit: Secondary | ICD-10-CM | POA: Diagnosis not present

## 2017-06-26 DIAGNOSIS — R29818 Other symptoms and signs involving the nervous system: Secondary | ICD-10-CM

## 2017-06-26 DIAGNOSIS — R2689 Other abnormalities of gait and mobility: Secondary | ICD-10-CM

## 2017-06-26 NOTE — Patient Instructions (Addendum)
Stop doing lying down back stretch with towel roll. Stop doing hamstring stretch (can do if time).     Feet Together (Compliant Surface) Head Motion - Eyes Closed    Stand on compliant surface:  with feet together. Close eyes and hold for 30 seconds.  Repeat 3 times per session. Do 1 sessions per day.  Copyright  VHI. All rights reserved.  Feet Partial Heel-Toe (Compliant Surface) Head Motion - Eyes Closed    Stand on compliant surface:  with right foot partially in front of the other. Eyes open and move head slowly, up and down, then left and right, then diagonally to one side and then the other side.  Repeat 10 reps each direction. Switch feet and repeat.  Do 1 sessions per day.  Copyright  VHI. All rights reserved.    Heel Raise: Unilateral (Standing)    Next to counter or chair back. Lightly use your hands for balance as needed. Balance on right foot, then rise on ball of foot. Repeat __10/15__ times per set. Do ___2_ sets per session. Do __1__ sessions per day.  *May also do on Left leg if time permits.   http://orth.exer.us/40   Copyright  VHI. All rights reserved.

## 2017-06-26 NOTE — Therapy (Signed)
[facility] [address] [place], [place], [address] Phone: [phone]   Fax:  [phone]  Physical Therapy Treatment  Patient Details  Name: Jeremiah Osborn MRN: [id number] Date of Birth: 1955-10-22 Referring Provider: Sharion Dove MD  Encounter Date: 06/26/2017      PT End of Session - 06/26/17 1331    Visit Number 9   Number of Visits 12  updated to 12 visit limit   Date for PT Re-Evaluation 07/08/17  12th visit   Authorization Type BC[employer]- 12 visit limit per discipline   Authorization - Visit Number 9   Authorization - Number of Visits 12  may apply for visits needed beyond 12   PT Start Time 1015   PT Stop Time 1100   PT Time Calculation (min) 45 min   Activity Tolerance Patient tolerated treatment well   Behavior During Therapy [facility] for tasks assessed/performed      Past Medical History:  Diagnosis Date  . Depression with anxiety   . Hypertension   . Prostate cancer ([place])   . Traumatic brain injury [facility])     Past Surgical History:  Procedure Laterality Date  . CATARACT EXTRACTION    . PROSTATE SURGERY  2010   removed    There were no vitals filed for this visit.      Subjective Assessment - 06/26/17 1016    Subjective No new complaints. No falls or pain to report. Has felt his balance has improved a litttle bit. Still worst when he first stands up. Has to remind his RLE to get moving.    Patient is accompained by: Family member  spouse in lobby   Patient Stated Goals Want to improve his walking; to not have to think so hard about initiating steps or when turning corners, curb, unlevel pavement; build endurance   Currently in Pain? No/denies                         [facility] Adult PT Treatment/Exercise - 06/26/17 0001      Ambulation/Gait   Ambulation/Gait Assistance 5: Supervision   Ambulation/Gait Assistance Details after treadmill with good carryover of = step length and  heelstrike, required vc for rt knee extension in stance to toe-off   Ambulation Distance (Feet) 150 Feet   Assistive device None   Gait Pattern Step-through pattern;Poor foot clearance - right;Right flexed knee in stance   Ambulation Surface Level     Lumbar Exercises: Machines for Strengthening   Leg Press --     Knee/Hip Exercises: Aerobic   Tread Mill 6 min 1.5-1.8 mph with focus on RLE heelstrike to toe-off and swing; emphasized full rt knee extension to set-up for improved swing     Knee/Hip Exercises: Machines for Strengthening   Cybex Leg Press 60# x 10 RLE only; 70# x 10 RLE only     Knee/Hip Exercises: Standing   Heel Raises Right;Left;2 sets;10 reps;15 reps   Heel Raises Limitations unilateral, light UE support, initial nearly full ROM, degrades with fatigue of exercise             Balance Exercises - 06/26/17 1327      Balance Exercises: Standing   Standing Eyes Opened Narrow base of support (BOS);Head turns;Foam/compliant surface  feet together & progressed to partial tandem, head all direc   Standing Eyes Closed Narrow base of support (BOS);Head turns;Foam/compliant surface;10 secs  pt can't maintain static up to 10 sec; d/c'd  head turns HEP           PT Education - 06/26/17 1331    Education provided Yes   Education Details changes and addition to HEP   Person(s) Educated Patient   Methods Explanation;Demonstration;Verbal cues;Handout   Comprehension Verbalized understanding;Returned demonstration;Verbal cues required;Need further instruction          PT Short Term Goals - 06/17/17 0936      PT SHORT TERM GOAL #1   Title Patient will complete HEP under supervision of his wife (due to cognitive status). (TARGET for all STGs by 6th of 12 approved visits)   Baseline 06/07/17: met today   Status Achieved     PT SHORT TERM GOAL #2   Title Patient will decrease time for 5x sit to stand to <13.5 seconds to demonstrate improved LE strength and balance.     Baseline 06/17/17: 13.07 sec's   Status Achieved     PT SHORT TERM GOAL #3   Title Patient will improve gait velocity to >3.0 ft/sec with no LOB or staggering.    Baseline 06/17/17: no change in gait speed on retest today (2.81 ft/sec today vs 2.88 on eval), however no staggering or LOB noted    Status Partially Met     PT SHORT TERM GOAL #4   Title Patient will complete FGA to establish baseline score. Will improve score by 3 points by 6th visit.   Baseline 06/17/17: increased by 2 points on recheck today (24/30 today vs 22/30 at baseline). improved just not to goal.    Status Partially Met           PT Long Term Goals - 05/27/17 2005      PT LONG TERM GOAL #1   Title Patient will improve 5x sit to stand time to <10 sec (approaching norm of 8.4 sec) demonstrating improved LE strength and overall balance. (TARGET for all LTGs by 12th visit)   Status New     PT LONG TERM GOAL #2   Title Patient will improve gait velocity to >=3.11 ft/sec (norm for his age group) on level surface without LOB.    Status New     PT LONG TERM GOAL #3   Title Patient will improve FGA score to >= 27/30 to demonstrate improved balance and lesser fall risk.    Status New     PT LONG TERM GOAL #4   Title Patient ambulating >1000 ft over unlevel terrain (grass, gravel, mulch, inclines/hill, declines, ramp, curb) with supervision only (no assist required for any LOB)   Status New     PT LONG TERM GOAL #5   Title Patient will verbalize plan for continued activity upon discharge from PT.    Status New               Plan - 06/26/17 1332    Clinical Impression Statement Session focused on gait training due to pt continues to drag RLE (scuffs foot). He denies causing LOB,but at times thinks he will lose his balance. Also focuse on RLE strengthening to support changes needed in RLE during gait. Patient with 3 more approved visits for PT. Did not meet all STGs and need to discuss with pt plans for  discharge vs requesting more visits from insurance. Patient could benefit from continued PT to further reduce fall risk.    Rehab Potential Good   PT Frequency 2x / week   PT Duration --  12 approved visits   PT Treatment/Interventions  ADLs/Self Care Home Management;DME Instruction;Gait training;Stair training;Functional mobility training;Therapeutic activities;Therapeutic exercise;Balance training;Neuromuscular re-education;Cognitive remediation;Patient/family education   PT Next Visit Plan continue pre-gait and gait training with incr stance time on RLE, rt knee extension in stance with rollover 1st toe to improve push-off into swing; continue to work on high level dynamic balance, balance on compliant surfaces and balance with vision removed. add to HEP as needed.    Consulted and Agree with Plan of Care Patient   Family Member Consulted wife, [name]      Patient will benefit from skilled therapeutic intervention in order to improve the following deficits and impairments:  Abnormal gait, Decreased activity tolerance, Decreased balance, Decreased cognition, Decreased coordination, Decreased safety awareness, Decreased strength, Impaired perceived functional ability  Visit Diagnosis: Other abnormalities of gait and mobility  Unsteadiness on feet  Other symptoms and signs involving the nervous system  Other lack of coordination     Problem List Patient Active Problem List   Diagnosis Date Noted  . TBI (traumatic brain injury) ([place]) 06/10/2017    Rexanne Mano, PT  06/26/2017, 1:37 PM  [facility] [address] [place], [place], [address] Phone: [phone]   Fax:  [phone]  Name: Jeremiah Osborn MRN: [id number] Date of Birth: 1956/06/22

## 2017-06-30 ENCOUNTER — Ambulatory Visit: Payer: BLUE CROSS/BLUE SHIELD | Admitting: Physical Therapy

## 2017-07-03 ENCOUNTER — Ambulatory Visit: Payer: BLUE CROSS/BLUE SHIELD | Admitting: Physical Therapy

## 2017-07-03 ENCOUNTER — Encounter: Payer: Self-pay | Admitting: Physical Therapy

## 2017-07-03 DIAGNOSIS — R41841 Cognitive communication deficit: Secondary | ICD-10-CM | POA: Diagnosis not present

## 2017-07-03 DIAGNOSIS — R2689 Other abnormalities of gait and mobility: Secondary | ICD-10-CM

## 2017-07-03 DIAGNOSIS — R29818 Other symptoms and signs involving the nervous system: Secondary | ICD-10-CM

## 2017-07-03 NOTE — Therapy (Signed)
[facility] [address] [place], [place], [address] Phone: [phone]   Fax:  [phone]  Physical Therapy Treatment  Patient Details  Name: Jeremiah Osborn MRN: [id number] Date of Birth: [birth year] Referring Provider: Sharion Dove MD  Encounter Date: 07/03/2017      PT End of Session - 07/03/17 1410    Visit Number 10   Number of Visits 12  updated to 12 visit limit   Date for PT Re-Evaluation 07/08/17  12th visit   Authorization Type [employer]- 12 visit limit per discipline   Authorization - Visit Number 10   Authorization - Number of Visits 12  may apply for visits needed beyond 12   PT Start Time 1322   PT Stop Time 1404   PT Time Calculation (min) 42 min   Activity Tolerance Patient tolerated treatment well   Behavior During Therapy [facility] for tasks assessed/performed      Past Medical History:  Diagnosis Date  . Depression with anxiety   . Hypertension   . Prostate cancer ([place])   . Traumatic brain injury [facility])     Past Surgical History:  Procedure Laterality Date  . CATARACT EXTRACTION    . PROSTATE SURGERY  2010   removed    There were no vitals filed for this visit.      Subjective Assessment - 07/03/17 1323    Subjective Yesterday was able to work 3 hours in son's yard getting up the leaves. A few losses of balance but no near falls. Felt really good to be so active. Will be out of town next week. Hopes he will qualify for additional PT visits. Will use gym at the apartment complex when he is discharged.    Patient is accompained by: Family member  spouse in lobby   Patient Stated Goals Want to improve his walking; to not have to think so hard about initiating steps or when turning corners, curb, unlevel pavement; build endurance   Currently in Pain? No/denies                         [facility] Adult PT Treatment/Exercise - 07/03/17 0001      Knee/Hip Exercises: Aerobic   Tread  Mill 5 min 1.5-1.4mh; 0.13 mi     Knee/Hip Exercises: Machines for Strengthening   Cybex Leg Press 100# x 20; 120# x 10   Other Machine PF on leg press with DF stretch                PT Education - 07/03/17 1409    Education provided Yes   Education Details addition to HEP; doing stretch when muscles are warm; adding cognitive challenges when ambulating with wife   Person(s) Educated Patient   Methods Explanation;Demonstration;Verbal cues;Handout   Comprehension Verbalized understanding;Returned demonstration;Need further instruction          PT Short Term Goals - 06/17/17 0936      PT SHORT TERM GOAL #1   Title Patient will complete HEP under supervision of his wife (due to cognitive status). (TARGET for all STGs by 6th of 12 approved visits)   Baseline 06/07/17: met today   Status Achieved     PT SHORT TERM GOAL #2   Title Patient will decrease time for 5x sit to stand to <13.5 seconds to demonstrate improved LE strength and balance.    Baseline 06/17/17: 13.07 sec's   Status Achieved     PT SHORT  TERM GOAL #3   Title Patient will improve gait velocity to >3.0 ft/sec with no LOB or staggering.    Baseline 06/17/17: no change in gait speed on retest today (2.81 ft/sec today vs 2.88 on eval), however no staggering or LOB noted    Status Partially Met     PT SHORT TERM GOAL #4   Title Patient will complete FGA to establish baseline score. Will improve score by 3 points by 6th visit.   Baseline 06/17/17: increased by 2 points on recheck today (24/30 today vs 22/30 at baseline). improved just not to goal.    Status Partially Met           PT Long Term Goals - 05/27/17 2005      PT LONG TERM GOAL #1   Title Patient will improve 5x sit to stand time to <10 sec (approaching norm of 8.4 sec) demonstrating improved LE strength and overall balance. (TARGET for all LTGs by 12th visit)   Status New     PT LONG TERM GOAL #2   Title Patient will improve gait velocity to  >=3.11 ft/sec (norm for his age group) on level surface without LOB.    Status New     PT LONG TERM GOAL #3   Title Patient will improve FGA score to >= 27/30 to demonstrate improved balance and lesser fall risk.    Status New     PT LONG TERM GOAL #4   Title Patient ambulating >1000 ft over unlevel terrain (grass, gravel, mulch, inclines/hill, declines, ramp, curb) with supervision only (no assist required for any LOB)   Status New     PT LONG TERM GOAL #5   Title Patient will verbalize plan for continued activity upon discharge from PT.    Status New               Plan - 07/03/17 1413    Clinical Impression Statement Session focused on gait training (including with treadmill) and cognitive challenges, novel tasks with visual distraction. Patient's ambulation significantly deteriorated when focused on cognitive task (decr velocity, shortened rt step length, lack of full knee extension). Discussed PT POC moving forward. Patient will be on vacation next week with no therapy appts, and on return (11th visit) will assess LTGs to help with determining discharge or recertification plans.    Rehab Potential Good   PT Frequency 2x / week   PT Duration --  12 approved visits   PT Treatment/Interventions ADLs/Self Care Home Management;DME Instruction;Gait training;Stair training;Functional mobility training;Therapeutic activities;Therapeutic exercise;Balance training;Neuromuscular re-education;Cognitive remediation;Patient/family education   PT Next Visit Plan Check LTGs (11th visit) and discuss plan for d/c or request addti'l visits); continue pre-gait and gait training with incr stance time on RLE, rt knee extension in stance with rollover 1st toe to improve push-off into swing; add cognitive challenges with gait; ?try Parkinson's exercise with varied directions/colors;  continue to work on high level dynamic balance, balance on compliant surfaces and balance with vision removed. add to HEP  as needed.    Consulted and Agree with Plan of Care Patient   Family Member Consulted wife, [name]      Patient will benefit from skilled therapeutic intervention in order to improve the following deficits and impairments:  Abnormal gait, Decreased activity tolerance, Decreased balance, Decreased cognition, Decreased coordination, Decreased safety awareness, Decreased strength, Impaired perceived functional ability  Visit Diagnosis: Other abnormalities of gait and mobility  Other symptoms and signs involving the nervous system  Problem List Patient Active Problem List   Diagnosis Date Noted  . TBI (traumatic brain injury) ([place]) 06/10/2017    Rexanne Mano, PT 07/03/2017, 2:20 PM  [facility] [address] [place], [place], [address] Phone: [phone]   Fax:  [phone]  Name: AVISHAI REIHL MRN: [id number] Date of Birth: [birth year]

## 2017-07-03 NOTE — Patient Instructions (Signed)
Hamstring Stretch, Seated (Strap, Two Chairs)    Sit with one leg extended onto facing chair or foot resting on the floor. Pull toes back towards your nose. (You can loop strap over outstretched ball of foot.) Lengthen spine. Hold for __30__ seconds. Repeat __3__ times each leg.  Copyright  VHI. All rights reserved.

## 2017-07-08 ENCOUNTER — Ambulatory Visit: Payer: BLUE CROSS/BLUE SHIELD | Admitting: Physical Therapy

## 2017-07-10 ENCOUNTER — Ambulatory Visit: Payer: BLUE CROSS/BLUE SHIELD | Admitting: Physical Therapy

## 2017-07-15 ENCOUNTER — Encounter: Payer: Self-pay | Admitting: Physical Therapy

## 2017-07-15 ENCOUNTER — Ambulatory Visit: Payer: BLUE CROSS/BLUE SHIELD | Attending: Physical Medicine & Rehabilitation | Admitting: Physical Therapy

## 2017-07-15 DIAGNOSIS — R2681 Unsteadiness on feet: Secondary | ICD-10-CM | POA: Diagnosis present

## 2017-07-15 DIAGNOSIS — R2689 Other abnormalities of gait and mobility: Secondary | ICD-10-CM | POA: Insufficient documentation

## 2017-07-16 NOTE — Therapy (Signed)
[facility] [address] [place], [place], [address] Phone: [phone]   Fax:  [phone]  Physical Therapy Treatment  Patient Details  Name: Jeremiah Osborn MRN: [id number] Date of Birth: 07/30/1956 Referring Provider: Fadia, Payal MD  Encounter Date: 07/15/2017      PT End of Session - 07/15/17 1453    Visit Number 11   Number of Visits 12  updated to 12 visit limit   Date for PT Re-Evaluation 07/08/17  12th visit   Authorization Type [employer]- 12 visit limit per discipline   Authorization - Visit Number 11   Authorization - Number of Visits 12  may apply for visits needed beyond 12   PT Start Time 1448   PT Stop Time 1530   PT Time Calculation (min) 42 min   Activity Tolerance Patient tolerated treatment well   Behavior During Therapy [facility] for tasks assessed/performed      Past Medical History:  Diagnosis Date  . Depression with anxiety   . Hypertension   . Prostate cancer ([facility])   . Traumatic brain injury ([facility])     Past Surgical History:  Procedure Laterality Date  . CATARACT EXTRACTION    . PROSTATE SURGERY  2010   removed    There were no vitals filed for this visit.      Subjective Assessment - 07/15/17 1450    Subjective No new complaints. Did work in yard for 2 days in a row last week for about 3 hours each. No falls, was tired afterwards. Also, climbed up/down a ladder with no issues (spouse was guarding just in case),    Patient is accompained by: Family member  spouse in lobby   Patient Stated Goals Want to improve his walking; to not have to think so hard about initiating steps or when turning corners, curb, unlevel pavement; build endurance   Currently in Pain? No/denies            [facility] PT Assessment - 07/15/17 1455      Transfers   Transfers Sit to Stand;Stand to Sit   Sit to Stand 6: Modified independent (Device/Increase time)   Stand to Sit 6: Modified independent  (Device/Increase time)     Ambulation/Gait   Ambulation/Gait Yes   Ambulation/Gait Assistance 6: Modified independent (Device/Increase time)   Ambulation/Gait Assistance Details occasional toe scuffing noted, no balance issues noted.    Ambulation Distance (Feet) 1000 Feet   Assistive device None   Gait Pattern Step-through pattern;Decreased stance time - right   Ambulation Surface Level;Unlevel;Indoor;Outdoor;Paved;Gravel;Grass  grassy hills   Gait velocity 10.07= 3.26 ft/sec no AD     Functional Gait  Assessment   Gait assessed  Yes   Gait Level Surface Walks 20 ft in less than 5.5 sec, no assistive devices, good speed, no evidence for imbalance, normal gait pattern, deviates no more than 6 in outside of the 12 in walkway width.   Change in Gait Speed Able to smoothly change walking speed without loss of balance or gait deviation. Deviate no more than 6 in outside of the 12 in walkway width.   Gait with Horizontal Head Turns Performs head turns smoothly with no change in gait. Deviates no more than 6 in outside 12 in walkway width   Gait with Vertical Head Turns Performs head turns with no change in gait. Deviates no more than 6 in outside 12 in walkway width.   Gait and Pivot Turn Pivot turns safely within 3   sec and stops quickly with no loss of balance.   Step Over Obstacle Is able to step over 2 stacked shoe boxes taped together (9 in total height) without changing gait speed. No evidence of imbalance.   Gait with Narrow Base of Support Ambulates less than 4 steps heel to toe or cannot perform without assistance.   Gait with Eyes Closed Walks 20 ft, no assistive devices, good speed, no evidence of imbalance, normal gait pattern, deviates no more than 6 in outside 12 in walkway width. Ambulates 20 ft in less than 7 sec.   Ambulating Backwards Walks 20 ft, no assistive devices, good speed, no evidence for imbalance, normal gait   Steps Alternating feet, must use rail.   Total Score 26                PT Short Term Goals - 06/17/17 0936      PT SHORT TERM GOAL #1   Title Patient will complete HEP under supervision of his wife (due to cognitive status). (TARGET for all STGs by 6th of 12 approved visits)   Baseline 06/07/17: met today   Status Achieved     PT SHORT TERM GOAL #2   Title Patient will decrease time for 5x sit to stand to <13.5 seconds to demonstrate improved LE strength and balance.    Baseline 06/17/17: 13.07 sec's   Status Achieved     PT SHORT TERM GOAL #3   Title Patient will improve gait velocity to >3.0 ft/sec with no LOB or staggering.    Baseline 06/17/17: no change in gait speed on retest today (2.81 ft/sec today vs 2.88 on eval), however no staggering or LOB noted    Status Partially Met     PT SHORT TERM GOAL #4   Title Patient will complete FGA to establish baseline score. Will improve score by 3 points by 6th visit.   Baseline 06/17/17: increased by 2 points on recheck today (24/30 today vs 22/30 at baseline). improved just not to goal.    Status Partially Met          PT Long Term Goals - 07/15/17 1453      PT LONG TERM GOAL #1   Title Patient will improve 5x sit to stand time to <10 sec (approaching norm of 8.4 sec) demonstrating improved LE strength and overall balance. (TARGET for all LTGs by 12th visit)   Status On-going     PT LONG TERM GOAL #2   Title Patient will improve gait velocity to >=3.11 ft/sec (norm for his age group) on level surface without LOB.    Baseline 07/15/17: 3.26 ft/sec no AD   Status Achieved     PT LONG TERM GOAL #3   Title Patient will improve FGA score to >= 27/30 to demonstrate improved balance and lesser fall risk.    Baseline 07/15/17: 26/30 scored today, progress made just no to goal   Status Partially Met     PT LONG TERM GOAL #4   Title Patient ambulating >1000 ft over unlevel terrain (grass, gravel, mulch, inclines/hill, declines, ramp, curb) with supervision only (no assist required for any  LOB)   Baseline 07/15/17: met today   Status Achieved     PT LONG TERM GOAL #5   Title Patient will verbalize plan for continued activity upon discharge from PT.    Baseline 07/15/17: pt has HEP, walking program and plans to use gym at apartment complex   Status Achieved  07/15/17 1453  Plan  Clinical Impression Statement Pt has met 3/5 LTGs, partially met 1 and has one goal left to check. Pt is on track to discharge at end of this week.  Pt will benefit from skilled therapeutic intervention in order to improve on the following deficits Abnormal gait;Decreased activity tolerance;Decreased balance;Decreased cognition;Decreased coordination;Decreased safety awareness;Decreased strength;Impaired perceived functional ability  Rehab Potential Good  PT Frequency 2x / week  PT Duration (12 approved visits)  PT Treatment/Interventions ADLs/Self Care Home Management;DME Instruction;Gait training;Stair training;Functional mobility training;Therapeutic activities;Therapeutic exercise;Balance training;Neuromuscular re-education;Cognitive remediation;Patient/family education  PT Next Visit Plan assess remaining LTG, finalize HEP (FOTO done, needs discharge only)  Consulted and Agree with Plan of Care Patient  Family Member Consulted wife, [name]      Patient will benefit from skilled therapeutic intervention in order to improve the following deficits and impairments:  Abnormal gait, Decreased activity tolerance, Decreased balance, Decreased cognition, Decreased coordination, Decreased safety awareness, Decreased strength, Impaired perceived functional ability  Visit Diagnosis: Other abnormalities of gait and mobility  Unsteadiness on feet     Problem List Patient Active Problem List   Diagnosis Date Noted  . TBI (traumatic brain injury) ([place]) 06/10/2017    Willow Ora, PTA, [facility] [address], [place], [place]  [id number] [phone] 07/16/17, 11:26 PM   Name: JACUB WAITERS MRN: [id number] Date of Birth: Mar 27, 1956

## 2017-07-17 ENCOUNTER — Ambulatory Visit: Payer: BLUE CROSS/BLUE SHIELD | Admitting: Physical Therapy

## 2017-07-17 ENCOUNTER — Encounter: Payer: Self-pay | Admitting: Physical Therapy

## 2017-07-17 DIAGNOSIS — R2681 Unsteadiness on feet: Secondary | ICD-10-CM

## 2017-07-17 DIAGNOSIS — R2689 Other abnormalities of gait and mobility: Secondary | ICD-10-CM

## 2017-07-17 NOTE — Therapy (Signed)
[name] [address] [place], [place], [address] Phone: [phone]   Fax:  [phone]  Physical Therapy Treatment and Discharge Summary  Patient Details  Name: Jeremiah Osborn MRN: [id number] Date of Birth: 1956-07-13 Referring Provider: Sharion Dove MD  Encounter Date: 07/17/2017      PT End of Session - 07/17/17 1352    Visit Number 12   Number of Visits 12  updated to 12 visit limit   Date for PT Re-Evaluation 07/08/17  12th visit   Authorization Type [employer]- 12 visit limit per discipline   Authorization - Visit Number 12   Authorization - Number of Visits 12  may apply for visits needed beyond 12   PT Start Time 1315   PT Stop Time 1344   PT Time Calculation (min) 29 min   Activity Tolerance Patient tolerated treatment well   Behavior During Therapy [name] for tasks assessed/performed      Past Medical History:  Diagnosis Date  . Depression with anxiety   . Hypertension   . Prostate cancer ([place])   . Traumatic brain injury [name])     Past Surgical History:  Procedure Laterality Date  . CATARACT EXTRACTION    . PROSTATE SURGERY  2010   removed    There were no vitals filed for this visit.      Subjective Assessment - 07/17/17 1309    Subjective Nothing new.    Patient is accompained by: Family member  spouse in lobby   Patient Stated Goals Want to improve his walking; to not have to think so hard about initiating steps or when turning corners, curb, unlevel pavement; build endurance   Currently in Pain? No/denies                         [name] Adult PT Treatment/Exercise - 07/17/17 1326      Transfers   Five time sit to stand comments  11.03     Knee/Hip Exercises: Aerobic   Tread Mill 5 min 2.0 mph 0.15 mi     Knee/Hip Exercises: Machines for Strengthening   Cybex Leg Press 110# bil LEs x 10; 70# unilateral x 10 x 2 sets             Balance Exercises - 07/17/17  1343      Balance Exercises: Standing   Other Standing Exercises in // bars on [place], EO, head turns horizontal and vertical           PT Education - 07/17/17 1351    Education provided Yes   Education Details briefly reviewed HEP--pt reports remains challenging and no updates needed; HEP includes good variety between strengthening, balance and flexibility; discussed his plan for activity on discharge    Person(s) Educated Patient   Methods Explanation   Comprehension Verbalized understanding          PT Short Term Goals - 06/17/17 0936      PT SHORT TERM GOAL #1   Title Patient will complete HEP under supervision of his wife (due to cognitive status). (TARGET for all STGs by 6th of 12 approved visits)   Baseline 06/07/17: met today   Status Achieved     PT SHORT TERM GOAL #2   Title Patient will decrease time for 5x sit to stand to <13.5 seconds to demonstrate improved LE strength and balance.    Baseline 06/17/17: 13.07 sec's   Status Achieved  PT SHORT TERM GOAL #3   Title Patient will improve gait velocity to >3.0 ft/sec with no LOB or staggering.    Baseline 06/17/17: no change in gait speed on retest today (2.81 ft/sec today vs 2.88 on eval), however no staggering or LOB noted    Status Partially Met     PT SHORT TERM GOAL #4   Title Patient will complete FGA to establish baseline score. Will improve score by 3 points by 6th visit.   Baseline 06/17/17: increased by 2 points on recheck today (24/30 today vs 22/30 at baseline). improved just not to goal.    Status Partially Met           PT Long Term Goals - 07/17/17 1353      PT LONG TERM GOAL #1   Title Patient will improve 5x sit to stand time to <10 sec (approaching norm of 8.4 sec) demonstrating improved LE strength and overall balance. (TARGET for all LTGs by 12th visit)   Baseline 10/11 11.03 secnds   Status Partially Met     PT LONG TERM GOAL #2   Title Patient will improve gait velocity to >=3.11  ft/sec (norm for his age group) on level surface without LOB.    Baseline 07/15/17: 3.26 ft/sec no AD   Status Achieved     PT LONG TERM GOAL #3   Title Patient will improve FGA score to >= 27/30 to demonstrate improved balance and lesser fall risk.    Baseline 07/15/17: 26/30 scored today, progress made just no to goal   Status Partially Met     PT LONG TERM GOAL #4   Title Patient ambulating >1000 ft over unlevel terrain (grass, gravel, mulch, inclines/hill, declines, ramp, curb) with supervision only (no assist required for any LOB)   Baseline 07/15/17: met today   Status Achieved     PT LONG TERM GOAL #5   Title Patient will verbalize plan for continued activity upon discharge from PT.    Baseline 07/15/17: pt has HEP, walking program and plans to use gym at apartment complex   Status Achieved               Plan - 07/17/17 1354    Clinical Impression Statement Patient's final LTG checked with pt partially meeting (improved just not to goal level). Overall, pt met 3/5 and partially met 2/5 goals. Patient plans to continue walking program and using gym equpment in the apartment complex. He and wife plan to join a gym when they move to their next assignment in January. Patient discharged from PT.    Rehab Potential Good   Consulted and Agree with Plan of Care Patient   Family Member Consulted wife, Jeremiah Osborn      Patient will benefit from skilled therapeutic intervention in order to improve the following deficits and impairments:     Visit Diagnosis: Other abnormalities of gait and mobility  Unsteadiness on feet     Problem List Patient Active Problem List   Diagnosis Date Noted  . TBI (traumatic brain injury) ([place]) 06/10/2017    PHYSICAL THERAPY DISCHARGE SUMMARY  Visits from Start of Care: 12  Current functional level related to goals / functional outcomes:     PT Long Term Goals - 07/17/17 1353      PT LONG TERM GOAL #1   Title Patient will improve 5x sit to  stand time to <10 sec (approaching norm of 8.4 sec) demonstrating improved LE strength and overall balance. (TARGET  for all LTGs by 12th visit)   Baseline 10/11 11.03 secnds   Status Partially Met     PT LONG TERM GOAL #2   Title Patient will improve gait velocity to >=3.11 ft/sec (norm for his age group) on level surface without LOB.    Baseline 07/15/17: 3.26 ft/sec no AD   Status Achieved     PT LONG TERM GOAL #3   Title Patient will improve FGA score to >= 27/30 to demonstrate improved balance and lesser fall risk.    Baseline 07/15/17: 26/30 scored today, progress made just no to goal   Status Partially Met     PT LONG TERM GOAL #4   Title Patient ambulating >1000 ft over unlevel terrain (grass, gravel, mulch, inclines/hill, declines, ramp, curb) with supervision only (no assist required for any LOB)   Baseline 07/15/17: met today   Status Achieved     PT LONG TERM GOAL #5   Title Patient will verbalize plan for continued activity upon discharge from PT.    Baseline 07/15/17: pt has HEP, walking program and plans to use gym at apartment complex   Status Achieved        Remaining deficits: RLE weakness and mild dyscoordination   Education / Equipment: See HEP  Plan: Patient agrees to discharge.  Patient goals were partially met. Patient is being discharged due to being pleased with the current functional level.  ?????       Rexanne Mano, PT 07/17/2017, 1:58 PM  [facility] [address] [place], [place], [address] Phone: [phone]   Fax:  [phone]  Name: Jeremiah Osborn MRN: [id number] Date of Birth: 10-01-1956

## 2017-07-24 ENCOUNTER — Ambulatory Visit (INDEPENDENT_AMBULATORY_CARE_PROVIDER_SITE_OTHER): Payer: BLUE CROSS/BLUE SHIELD | Admitting: Neurology

## 2017-07-24 ENCOUNTER — Encounter: Payer: Self-pay | Admitting: Neurology

## 2017-07-24 VITALS — BP 143/83 | HR 89 | Ht 73.0 in | Wt 188.0 lb

## 2017-07-24 DIAGNOSIS — G8191 Hemiplegia, unspecified affecting right dominant side: Secondary | ICD-10-CM

## 2017-07-24 DIAGNOSIS — S069X4S Unspecified intracranial injury with loss of consciousness of 6 hours to 24 hours, sequela: Secondary | ICD-10-CM | POA: Diagnosis not present

## 2017-07-24 DIAGNOSIS — R413 Other amnesia: Secondary | ICD-10-CM
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
## 2017-08-06 ENCOUNTER — Telehealth: Payer: Self-pay | Admitting: *Deleted

## 2017-08-06 ENCOUNTER — Ambulatory Visit (INDEPENDENT_AMBULATORY_CARE_PROVIDER_SITE_OTHER): Payer: BLUE CROSS/BLUE SHIELD | Admitting: Neurology

## 2017-08-06 DIAGNOSIS — R410 Disorientation, unspecified: Secondary | ICD-10-CM

## 2017-08-06 DIAGNOSIS — S069X4S Unspecified intracranial injury with loss of consciousness of 6 hours to 24 hours, sequela: Secondary | ICD-10-CM

## 2017-08-06 MED ORDER — DESIPRAMINE HCL 50 MG PO TABS: 50.0000 mg | ORAL_TABLET | Freq: Every day | ORAL | 5 refills | Status: DC

## 2017-08-06 NOTE — Telephone Encounter (Addendum)
Per vo by Dr. Krista Blue, authorize refills for the patient.  Rx for desipramine 50mg , one tablet at bedtime sent to pharmacy.

## 2017-08-06 NOTE — Telephone Encounter (Signed)
Patient is here for and EEG and is requesting a refill of Desipramine.  Please send to [facility].

## 2017-08-07 NOTE — Procedures (Signed)
   HISTORY: 61 years old male, have a history of traumatic brain injury, memory loss,  TECHNIQUE:  16 channel EEG was performed based on standard 10-16 international system. One channel was dedicated to EKG, which has demonstrates normal sinus rhythm of 90 beats per minutes.  Upon awakening, the posterior background activity was well-developed, in alpha range, 9 Hz, reactive to eye opening and closure.  There was no evidence of epileptiform discharge.  Photic stimulation was performed, which induced a symmetric photic driving.  Hyperventilation was not performed.  No sleep was achieved.  CONCLUSION: This is a  normal awake EEG.  There is no electrodiagnostic evidence of epileptiform discharge.  [name], M.D. Ph.D.  [facility] [place], [place] [address] Phone: [phone] Fax:      [phone]

## 2017-10-20 ENCOUNTER — Telehealth: Payer: Self-pay | Admitting: Neurology

## 2017-10-20 NOTE — Telephone Encounter (Signed)
1) He is aware his  EEG was normal.  2) We have not received his records yet but will request help from Hilda Blades in medical records.

## 2017-10-20 NOTE — Telephone Encounter (Signed)
Pt has called asking if the results from Dr Alvina Filbert @ [facility]) has been received, pt is also asking for a call with the results of his EEG.  Please call

## 2017-10-21 NOTE — Telephone Encounter (Signed)
Medical records received and provided to Dr. Krista Blue for review.

## 2017-11-06 NOTE — Telephone Encounter (Signed)
Spoke to patient's wife - his appt has been moved to 11/13/17.
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
## 2017-11-13 ENCOUNTER — Encounter: Payer: Self-pay | Admitting: Neurology

## 2017-11-13 ENCOUNTER — Ambulatory Visit (INDEPENDENT_AMBULATORY_CARE_PROVIDER_SITE_OTHER): Payer: BLUE CROSS/BLUE SHIELD | Admitting: Neurology

## 2017-11-13 ENCOUNTER — Encounter (INDEPENDENT_AMBULATORY_CARE_PROVIDER_SITE_OTHER): Payer: Self-pay

## 2017-11-13 ENCOUNTER — Telehealth: Payer: Self-pay | Admitting: Neurology

## 2017-11-13 VITALS — BP 117/78 | HR 100 | Ht 73.0 in | Wt 192.0 lb

## 2017-11-13 DIAGNOSIS — S069X4S Unspecified intracranial injury with loss of consciousness of 6 hours to 24 hours, sequela: Secondary | ICD-10-CM

## 2017-11-13 DIAGNOSIS — R413 Other amnesia: Secondary | ICD-10-CM | POA: Diagnosis not present

## 2017-11-13 MED ORDER — PROPRANOLOL HCL ER 80 MG PO CP24: 80.0000 mg | ORAL_CAPSULE | Freq: Every day | ORAL | 11 refills | Status: DC

## 2017-11-13 MED ORDER — DESIPRAMINE HCL 50 MG PO TABS: 50.0000 mg | ORAL_TABLET | Freq: Every day | ORAL | 4 refills | Status: DC
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
## 2017-11-13 NOTE — Telephone Encounter (Signed)
Gets neuropsychiatric evaluation records from his neuropsychologist at [facility] Dr. Lillie Columbia [phone]

## 2017-11-13 NOTE — Patient Instructions (Signed)
Dr. Thompson Grayer

## 2017-11-13 NOTE — Telephone Encounter (Signed)
Meriel Pica B/[facility] [phone] report Rn is requesting will not be available until tomorrow. FYI

## 2017-11-13 NOTE — Telephone Encounter (Signed)
Per [facility] - this report should be ready on 11/14/17.

## 2017-11-18 ENCOUNTER — Ambulatory Visit (INDEPENDENT_AMBULATORY_CARE_PROVIDER_SITE_OTHER): Payer: BLUE CROSS/BLUE SHIELD | Admitting: Internal Medicine

## 2017-11-18 ENCOUNTER — Encounter: Payer: Self-pay | Admitting: Internal Medicine

## 2017-11-18 ENCOUNTER — Telehealth: Payer: Self-pay | Admitting: Neurology

## 2017-11-18 VITALS — BP 153/98 | HR 94 | Ht 73.0 in | Wt 194.4 lb

## 2017-11-18 DIAGNOSIS — R55 Syncope and collapse: Secondary | ICD-10-CM

## 2017-11-18 DIAGNOSIS — S069X9D Unspecified intracranial injury with loss of consciousness of unspecified duration, subsequent encounter: Secondary | ICD-10-CM

## 2017-11-18 NOTE — Telephone Encounter (Signed)
I have called him, received [facility] neuropsychiatric evaluations result. Patient still have deficit at multiple area, especially language difficulty, suggested speech therapy.  He will email job requirement, for me to generate statement about his future job placement.
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
## 2017-11-18 NOTE — Telephone Encounter (Signed)
Copy of neuropsychiatric testing placed up front for patient to pick up.

## 2017-11-18 NOTE — Patient Instructions (Addendum)
Medication Instructions:  Your physician recommends that you continue on your current medications as directed. Please refer to the Current Medication list given to you today.  Labwork: None ordered.  Testing/Procedures: Your physician has requested that you have an echocardiogram. Echocardiography is a painless test that uses sound waves to create images of your heart. It provides your doctor with information about the size and shape of your heart and how well your heart's chambers and valves are working. This procedure takes approximately one hour. There are no restrictions for this procedure.  Your physician has requested that you have a cardiac MRI. Cardiac MRI uses a computer to create images of your heart as its beating, producing both still and moving pictures of your heart and major blood vessels. For further information please visit [email or web address] Please follow the instruction sheet given to you today for more information.    Follow-Up:  Your physician recommends that you schedule a follow-up appointment based on your Echo and Cardiac MRI results.   Any Other Special Instructions Will Be Listed Below (If Applicable).      If you need a refill on your cardiac medications before your next appointment, please call your pharmacy.

## 2017-11-19 ENCOUNTER — Telehealth: Payer: Self-pay | Admitting: Internal Medicine

## 2017-11-19 NOTE — Telephone Encounter (Signed)
Called patient and LVM to call me back with the information on when he wants his MRI scheduled.

## 2017-11-21 ENCOUNTER — Encounter: Payer: Self-pay | Admitting: Internal Medicine

## 2017-11-25 ENCOUNTER — Ambulatory Visit (HOSPITAL_COMMUNITY): Payer: BLUE CROSS/BLUE SHIELD | Attending: Internal Medicine

## 2017-11-25 ENCOUNTER — Other Ambulatory Visit: Payer: Self-pay

## 2017-11-25 DIAGNOSIS — R000 Tachycardia, unspecified: Secondary | ICD-10-CM | POA: Diagnosis not present

## 2017-11-25 DIAGNOSIS — R55 Syncope and collapse: Secondary | ICD-10-CM | POA: Insufficient documentation

## 2017-11-25 DIAGNOSIS — S069X9D Unspecified intracranial injury with loss of consciousness of unspecified duration, subsequent encounter: Secondary | ICD-10-CM | POA: Diagnosis not present

## 2017-11-25 DIAGNOSIS — I10 Essential (primary) hypertension: Secondary | ICD-10-CM | POA: Insufficient documentation

## 2017-12-04 ENCOUNTER — Other Ambulatory Visit (HOSPITAL_COMMUNITY): Payer: BLUE CROSS/BLUE SHIELD

## 2017-12-08 ENCOUNTER — Encounter: Payer: Self-pay | Admitting: Neurology

## 2017-12-08 ENCOUNTER — Telehealth: Payer: Self-pay | Admitting: Neurology

## 2017-12-09 ENCOUNTER — Encounter: Payer: Self-pay | Admitting: Neurology

## 2017-12-09 NOTE — Telephone Encounter (Signed)
Letter was generated

## 2017-12-12 ENCOUNTER — Other Ambulatory Visit (HOSPITAL_COMMUNITY): Payer: BLUE CROSS/BLUE SHIELD

## 2017-12-12 ENCOUNTER — Ambulatory Visit (HOSPITAL_COMMUNITY): Payer: BLUE CROSS/BLUE SHIELD

## 2017-12-15 ENCOUNTER — Encounter: Payer: Self-pay | Admitting: Occupational Therapy

## 2017-12-17 ENCOUNTER — Encounter: Payer: Self-pay | Admitting: Internal Medicine

## 2018-01-02 ENCOUNTER — Ambulatory Visit (HOSPITAL_COMMUNITY)
Admission: RE | Admit: 2018-01-02 | Discharge: 2018-01-02 | Disposition: A | Discharge status: Home / Self Care | Payer: BLUE CROSS/BLUE SHIELD | Source: Ambulatory Visit | Attending: Internal Medicine | Admitting: Internal Medicine

## 2018-01-02 DIAGNOSIS — R55 Syncope and collapse: Secondary | ICD-10-CM | POA: Diagnosis not present

## 2018-01-02 LAB — CREATININE, SERUM
Creatinine, Ser: 0.96 mg/dL (ref 0.61–1.24)
GFR calc Af Amer: >60 mL/min (ref >60)
GFR calc non Af Amer: >60 mL/min (ref >60)

## 2018-01-02 MED ORDER — GADOBENATE DIMEGLUMINE 529 MG/ML IV SOLN
30.0000 mL | Freq: Once | INTRAVENOUS | Status: AC | PRN
  Administered 2018-01-02: 30 mL via INTRAVENOUS

## 2018-01-08 ENCOUNTER — Encounter: Payer: Self-pay | Admitting: Internal Medicine

## 2018-01-20 ENCOUNTER — Telehealth: Payer: Self-pay

## 2018-01-20 NOTE — Telephone Encounter (Signed)
-----   Message from Deboraha Sprang, MD sent at 01/18/2018 12:37 PM EDT ----- Please Inform Patient that study was normal This is great news  Thanks

## 2018-01-20 NOTE — Telephone Encounter (Signed)
Pt is aware and agreeable to normal results  

## 2018-01-21 ENCOUNTER — Encounter: Payer: Self-pay | Admitting: Neurology

## 2018-01-21 ENCOUNTER — Encounter: Payer: Self-pay | Admitting: Internal Medicine

## 2018-01-22 ENCOUNTER — Ambulatory Visit: Payer: BLUE CROSS/BLUE SHIELD | Admitting: Neurology

## 2018-02-27 ENCOUNTER — Encounter: Payer: Self-pay | Admitting: Neurology

## 2018-03-03 ENCOUNTER — Telehealth: Payer: Self-pay | Admitting: *Deleted

## 2018-03-03 NOTE — Telephone Encounter (Signed)
Letter sent to patient through my chart.

## 2018-11-23 ENCOUNTER — Other Ambulatory Visit: Payer: Self-pay | Admitting: Neurology

## 2019-02-24 MED ADMIN — sodium fluoride 1.1 % dental paste: DENTAL | @ 09:00:00 | NDC 00126007492

## 2021-12-10 ENCOUNTER — Telehealth: Payer: Self-pay | Admitting: Internal Medicine

## 2021-12-10 NOTE — Telephone Encounter (Signed)
Vml for pt to call back and sch new patient appt approved by Dr Yong Channel- see previous message from [facility] when making apt for pt -  ?

## 2021-12-10 NOTE — Telephone Encounter (Signed)
Yes, this is fine. Reference sons name in appointment notes. ?

## 2021-12-10 NOTE — Telephone Encounter (Signed)
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 Mr Grewe would like to know if you would accept him as a new patient. ?  ?

## 2022-11-18 ENCOUNTER — Encounter: Payer: Self-pay | Admitting: Internal Medicine

## 2022-11-18 ENCOUNTER — Ambulatory Visit (INDEPENDENT_AMBULATORY_CARE_PROVIDER_SITE_OTHER): Payer: Medicare Other | Admitting: Internal Medicine

## 2022-11-18 VITALS — BP 130/80 | HR 64 | Temp 98.0°F | Ht 73.0 in | Wt 193.2 lb

## 2022-11-18 DIAGNOSIS — S069X4S Unspecified intracranial injury with loss of consciousness of 6 hours to 24 hours, sequela: Secondary | ICD-10-CM

## 2022-11-18 DIAGNOSIS — Z1211 Encounter for screening for malignant neoplasm of colon: Secondary | ICD-10-CM | POA: Diagnosis not present

## 2022-11-18 DIAGNOSIS — R413 Other amnesia: Secondary | ICD-10-CM

## 2022-11-18 DIAGNOSIS — Z9079 Acquired absence of other genital organ(s): Secondary | ICD-10-CM

## 2022-11-18 DIAGNOSIS — E785 Hyperlipidemia, unspecified: Secondary | ICD-10-CM

## 2022-11-18 DIAGNOSIS — I10 Essential (primary) hypertension: Secondary | ICD-10-CM

## 2022-11-18 DIAGNOSIS — F325 Major depressive disorder, single episode, in full remission: Secondary | ICD-10-CM

## 2022-11-18 DIAGNOSIS — Z0000 Encounter for general adult medical examination without abnormal findings: Secondary | ICD-10-CM

## 2022-11-18 DIAGNOSIS — G8191 Hemiplegia, unspecified affecting right dominant side: Secondary | ICD-10-CM

## 2022-11-18 HISTORY — DX: Major depressive disorder, single episode, in full remission: F32.5

## 2022-11-18 HISTORY — DX: Hyperlipidemia, unspecified: E78.5

## 2022-11-18 LAB — CBC
HCT: 46.6 % (ref 39.0–52.0)
Hemoglobin: 16.1 g/dL (ref 13.0–17.0)
MCHC: 34.5 g/dL (ref 30.0–36.0)
MCV: 92.5 fl (ref 78.0–100.0)
Platelets: 346 10*3/uL (ref 150.0–400.0)
RBC: 5.04 Mil/uL (ref 4.22–5.81)
RDW: 12.6 % (ref 11.5–15.5)
WBC: 8.4 10*3/uL (ref 4.0–10.5)

## 2022-11-18 LAB — LIPID PANEL
Cholesterol: 137 mg/dL (ref 0–200)
HDL: 56.8 mg/dL (ref >39.00)
LDL Cholesterol: 58 mg/dL (ref 0–99)
NonHDL: 79.71
Total CHOL/HDL Ratio: 2
Triglycerides: 108 mg/dL (ref 0.0–149.0)
VLDL: 21.6 mg/dL (ref 0.0–40.0)

## 2022-11-18 LAB — COMPREHENSIVE METABOLIC PANEL
ALT: 29 U/L (ref 0–53)
AST: 23 U/L (ref 0–37)
Albumin: 4.3 g/dL (ref 3.5–5.2)
Alkaline Phosphatase: 60 U/L (ref 39–117)
BUN: 19 mg/dL (ref 6–23)
CO2: 24 mEq/L (ref 19–32)
Calcium: 9.7 mg/dL (ref 8.4–10.5)
Chloride: 104 mEq/L (ref 96–112)
Creatinine, Ser: 0.93 mg/dL (ref 0.40–1.50)
GFR: 84.52 mL/min (ref >60.00)
Glucose, Bld: 117 mg/dL — ABNORMAL HIGH (ref 70–99)
Potassium: 5 mEq/L (ref 3.5–5.1)
Sodium: 137 mEq/L (ref 135–145)
Total Bilirubin: 0.5 mg/dL (ref 0.2–1.2)
Total Protein: 6.7 g/dL (ref 6.0–8.3)

## 2022-11-18 LAB — PSA: PSA: 0.01 ng/mL — ABNORMAL LOW (ref 0.10–4.00)

## 2022-11-18 MED ORDER — PROPRANOLOL HCL 80 MG PO TABS: 80.0000 mg | ORAL_TABLET | Freq: Every day | ORAL | 3 refills | Status: DC

## 2022-11-18 MED ORDER — AMLODIPINE BESYLATE 5 MG PO TABS: 5.0000 mg | ORAL_TABLET | Freq: Every day | ORAL | 3 refills | Status: DC

## 2022-11-18 MED ORDER — DESIPRAMINE HCL 50 MG PO TABS: 50.0000 mg | ORAL_TABLET | Freq: Every day | ORAL | 4 refills | Status: DC

## 2022-11-18 NOTE — Patient Instructions (Addendum)
It was a pleasure seeing you today!  Your health and satisfaction are my top priorities. If you believe your experience today was worthy of a 5-star rating, I'd be grateful for your feedback! Loralee Pacas, MD   CHECKOUT CHECKLIST  []$    Schedule next appointment(s):    Return for chronic disease monitoring and management.  Any requested lab visits should be scheduled as appointments too  If you are not doing well:  Return to the office sooner Please bring all your medicine bottles to each appointment If your condition begins to worsen or become severe:  go to the emergency room or even call 911  []$    Sign release of information authorizations: Any records we need for your care and to be your medical home We need vaccine and cancer screening and last physician notes and consultant notes Dr. Doy Mince in [facility]   []$   (Optional):  Review your clinical notes on MyChart after they are completed.     At the [facility], ask about the provider referral program- tell them Berniece Pap, MD would like you to join  Today's draft of the physician documented plan for today's visit: (final revisions will be visible on MyChart chart later) Preventative health care -     CBC -     Comprehensive metabolic panel -     Lipid panel  Colon cancer screening -     Cologuard  Traumatic brain injury, with loss of consciousness of 6 hours to 24 hours, sequela (HCC)  Hypertension, unspecified type -     amLODIPine Besylate; Take 1 tablet (5 mg total) by mouth daily.  Dispense: 90 tablet; Refill: 3 -     Propranolol HCl; Take 1 tablet (80 mg total) by mouth daily.  Dispense: 90 tablet; Refill: 3  Hyperlipidemia, acquired Assessment & Plan: Encouraged continuing with rosuvastatin Will monitor lipids   Major depressive disorder, single episode, in remission [facility]) Assessment & Plan: Feels well-controlled at this time with long term desipramine Doesn't following with psychiatry or behavioral  health counselor - used to    Orders: -     Desipramine HCl; Take 1 tablet (50 mg total) by mouth at bedtime.  Dispense: 90 tablet; Refill: 4  Right hemiparesis (HCC)  Memory loss  History of prostatectomy -     PSA    QUESTIONS & CONCERNS: CLINICAL: please contact us via phone [phone] OR MyChart messaging  LAB & IMAGING:   We will call you if the results are significantly abnormal or you don't use MyChart.  Most normal results will be posted to MyChart immediately and have a clinical review message by Dr. Randol Kern posted within 2-3 business days.   If you have not heard from [place] regarding the results in 2 weeks OR if you need priority reporting, please contact this office. MYCHART:  The fastest way to get your results and easiest way to stay in touch with [place] is by activating your My Chart account. Instructions are located on the last page of this paperwork.  BILLING: xray and lab orders are billed from separate companies and questions./concerns should be directed to the [place].  For visit charges please discuss with our administrative services COMPLAINTS:  please let Dr. Randol Kern know or see the [place], by asking at the front desk: we want you to be satisfied with every experience and we would be grateful for the opportunity to address any problems

## 2022-11-18 NOTE — Assessment & Plan Note (Addendum)
Encouraged continuing with rosuvastatin Will monitor lipids and get baseline Denies history cad
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
## 2022-11-18 NOTE — Assessment & Plan Note (Addendum)
Patient reports Feels well-controlled at this time with long term desipramine No longer follow with psychiatry or behavioral health counselor - used to but doesn't feel present need Prior attempt to change desipr

## 2022-11-18 NOTE — Assessment & Plan Note (Signed)
HTN -- BP in office performed and is well controlled. He  reports no side effects to the medications, no chest pain, SOB, dizziness or headaches. He has a BP cuff at home and is checking BP regularly, reports they are in the normal range.   Offered to send all medication for 1 year(s) and he agreed Will follow up 1 year(s) and continue to monitor

## 2022-11-24 ENCOUNTER — Encounter (INDEPENDENT_AMBULATORY_CARE_PROVIDER_SITE_OTHER): Payer: Self-pay

## 2022-11-25 ENCOUNTER — Other Ambulatory Visit: Payer: Self-pay | Admitting: Internal Medicine

## 2022-11-25 MED ORDER — ROSUVASTATIN CALCIUM 20 MG PO TABS
20.0000 mg | ORAL_TABLET | Freq: Every day | ORAL | 3 refills | Status: DC
  Filled 2022-11-25: qty 90, 90d supply, fill #0

## 2022-11-25 MED ORDER — ROSUVASTATIN CALCIUM 20 MG PO TABS
20.0000 mg | ORAL_TABLET | Freq: Every day | ORAL | 3 refills | Status: DC
  Filled 2022-11-25: qty 90, 90d supply, fill #0
  Filled 2022-12-11: qty 30, 30d supply, fill #0

## 2022-11-26 ENCOUNTER — Other Ambulatory Visit: Payer: Self-pay

## 2022-11-26 ENCOUNTER — Other Ambulatory Visit (HOSPITAL_COMMUNITY): Payer: Self-pay

## 2022-12-04 LAB — COLOGUARD: COLOGUARD: POSITIVE — AB

## 2022-12-05 NOTE — Progress Notes (Signed)
Notify patient: Cologuard testing was positive, this is usually due to precancer polyps that can be removed at colonoscopy.  My recommendation is to do a full colonoscopy, please let us refer to gastrointestinal specialist for positive Cologuard (please add Cologuard positive diagnosis to problems and referral also under it if patient agrees) Loralee Pacas, MD  12/05/2022 12:54 PM

## 2022-12-10 ENCOUNTER — Other Ambulatory Visit: Payer: Self-pay

## 2022-12-10 DIAGNOSIS — R195 Other fecal abnormalities: Secondary | ICD-10-CM

## 2022-12-11 ENCOUNTER — Other Ambulatory Visit: Payer: Self-pay

## 2022-12-11 ENCOUNTER — Encounter (INDEPENDENT_AMBULATORY_CARE_PROVIDER_SITE_OTHER): Payer: Self-pay | Admitting: *Deleted

## 2022-12-16 ENCOUNTER — Encounter: Payer: Self-pay | Admitting: Internal Medicine

## 2022-12-17 ENCOUNTER — Other Ambulatory Visit: Payer: Self-pay

## 2022-12-17 ENCOUNTER — Ambulatory Visit: Payer: BLUE CROSS/BLUE SHIELD | Admitting: Internal Medicine

## 2022-12-17 MED ORDER — ROSUVASTATIN CALCIUM 20 MG PO TABS: 20.0000 mg | ORAL_TABLET | Freq: Every day | ORAL | 3 refills | Status: DC

## 2022-12-17 NOTE — Telephone Encounter (Signed)
Spoke with patient and informed him that I sent the prescription to the correct pharmacy.

## 2023-01-30 ENCOUNTER — Ambulatory Visit (INDEPENDENT_AMBULATORY_CARE_PROVIDER_SITE_OTHER): Payer: BLUE CROSS/BLUE SHIELD | Admitting: Gastroenterology

## 2023-01-30 ENCOUNTER — Encounter (INDEPENDENT_AMBULATORY_CARE_PROVIDER_SITE_OTHER): Payer: Self-pay | Admitting: Gastroenterology

## 2023-01-30 VITALS — BP 117/77 | HR 79 | Temp 98.4°F | Ht 73.0 in | Wt 194.1 lb

## 2023-01-30 DIAGNOSIS — R195 Other fecal abnormalities: Secondary | ICD-10-CM

## 2023-01-30 DIAGNOSIS — Z86010 Personal history of colonic polyps: Secondary | ICD-10-CM | POA: Insufficient documentation

## 2023-01-30 MED ORDER — PEG 3350-KCL-NA BICARB-NACL 420 G PO SOLR: 4000.0000 mL | Freq: Once | ORAL | 0 refills | Status: AC

## 2023-01-30 NOTE — Patient Instructions (Signed)
Schedule colonoscopy

## 2023-01-30 NOTE — Progress Notes (Signed)
Jeremiah Osborn, M.D. Gastroenterology & Hepatology [facility] Gastroenterology [address] [place], [place] [address] Primary Care Physician: Jeremiah Olszewski, MD [address] [place] [address]  Referring MD: PCP  Chief Complaint: Positive Cologuard  History of Present Illness: Jeremiah Osborn is a 67 y.o. male with past medical history of hyperlipidemia, hypertension, depression, prostate cancer, who presents for evaluation of Positive Cologuard.  Patient was referred for evaluation of positive Cologuard on 11/25/22.  Patient reports that he has had colonsocopies since his 30s due to a history of rectal bleeding  chronically. He does not know exactly what Is the reason for his rectal bleeding. He states having very frequent bleeding in the past, but has not had any rectal bleeding for multiple years.  The patient denies having any nausea, vomiting, fever, chills, melena, heartburn, dysphagia, hematemesis, abdominal distention, abdominal pain, diarrhea, jaundice, pruritus or weight loss.  Last UJW:[id number] had it done in the past Last Colonoscopy:4 years ago, reports he had polyps removed . This was performed at [facility], [place] - no report available, patient does not remember which facility this was done.  FHx: neg for any gastrointestinal/liver disease, father had cholangiocarcinoma Social: neg smoking, alcohol or illicit drug use Surgical: no abdominal surgeries  Past Medical History: Past Medical History:  Diagnosis Date   Depression with anxiety    Hyperlipidemia, acquired 11/18/2022   Lipid Panel  No results found for: "CHOL", "TRIG", "HDL", "CHOLHDL", "VLDL", "LDLCALC", "LDLDIRECT", "LABVLDL"    Hypertension    Major depressive disorder, single episode, in remission 11/18/2022   Long term desipramine since being in his 37s   Prostate cancer    Traumatic brain injury     Past Surgical History: Past Surgical History:  Procedure  Laterality Date   CATARACT EXTRACTION     EYE SURGERY  2015   PROSTATE SURGERY  2010   removed    Family History: Family History  Problem Relation Age of Onset   Heart disease Mother    Cancer Father        bowel duct cancer    Social History: Social History   Tobacco Use  Smoking Status Former   Types: Cigarettes   Quit date: 10/07/1982   Years since quitting: 40.3  Smokeless Tobacco Never   Social History   Substance and Sexual Activity  Alcohol Use No   Comment: Quit 1978   Social History   Substance and Sexual Activity  Drug Use Never    Allergies: Not on File  Medications: Current Outpatient Medications  Medication Sig Dispense Refill   amLODipine (NORVASC) 5 MG tablet Take 1 tablet (5 mg total) by mouth daily. 90 tablet 3   desipramine (NORPRAMIN) 50 MG tablet Take 1 tablet (50 mg total) by mouth at bedtime. 90 tablet 4   propranolol (INDERAL) 80 MG tablet Take 1 tablet (80 mg total) by mouth daily. 90 tablet 3   rosuvastatin (CRESTOR) 20 MG tablet Take 1 tablet (20 mg total) by mouth at bedtime. 90 tablet 3   No current facility-administered medications for this visit.    Review of Systems: GENERAL: negative for malaise, night sweats HEENT: No changes in hearing or vision, no nose bleeds or other nasal problems. NECK: Negative for lumps, goiter, pain and significant neck swelling RESPIRATORY: Negative for cough, wheezing CARDIOVASCULAR: Negative for chest pain, leg swelling, palpitations, orthopnea GI: SEE HPI MUSCULOSKELETAL: Negative for joint pain or swelling, back pain, and muscle pain. SKIN: Negative for  lesions, rash PSYCH: Negative for sleep disturbance, mood disorder and recent psychosocial stressors. HEMATOLOGY Negative for prolonged bleeding, bruising easily, and swollen nodes. ENDOCRINE: Negative for cold or heat intolerance, polyuria, polydipsia and goiter. NEURO: negative for tremor, gait imbalance, syncope and seizures. The remainder  of the review of systems is noncontributory.   Physical Exam: BP 117/77 (BP Location: Left Arm, Patient Position: Sitting, Cuff Size: Large)   Pulse 79   Temp 98.4 F (36.9 C) (Temporal)   Ht  (1.854 m)   Wt 194 lb 1.6 oz (88 kg)   BMI 25.61 kg/m  GENERAL: The patient is AO x3, in no acute distress. HEENT: Head is normocephalic and atraumatic. EOMI are intact. Mouth is well hydrated and without lesions. NECK: Supple. No masses LUNGS: Clear to auscultation. No presence of rhonchi/wheezing/rales. Adequate chest expansion HEART: RRR, normal s1 and s2. ABDOMEN: Soft, nontender, no guarding, no peritoneal signs, and nondistended. BS +. No masses. EXTREMITIES: Without any cyanosis, clubbing, rash, lesions or edema. NEUROLOGIC: AOx3, no focal motor deficit. SKIN: no jaundice, no rashes   Imaging/Labs: as above  I personally reviewed and interpreted the available labs, imaging and endoscopic files.  Impression and Plan: Jeremiah Osborn is a 67 y.o. male with past medical history of hyperlipidemia, hypertension, depression, prostate cancer, who presents for evaluation of Positive Cologuard.  Patient does not have any high risk factors for colorectal cancer malignancy.  She has been asymptomatic. Discussed cologuard test results in detail, specifically what it means when the test is positive or negative.  Discussed that there is a possibility that even when the test is positive there may not be a polyp found on colonoscopy. More than 50% of the office visit was dedicated to discussing the procedure, including the day of and risks involved. Patient understands what the procedure involves including the benefits and any risks. Patient understands alternatives to the proposed procedure. Risks including (but not limited to) bleeding, tearing of the lining (perforation), rupture of adjacent organs, problems with heart and lung function, infection, and medication reactions. A small percentage of  complications may require surgery, hospitalization, repeat endoscopic procedure, and/or transfusion. A small percentage of polyps and other tumors may not be seen.  I emphasized to the patient the importance of continuing surveillance with colonoscopies regardless of the findings on the next colonoscopy as he has a history of colonic polyps.  The patient is at increased risk for [facility] and should continue for colonoscopies for screening.  - Schedule colonoscopy  All questions were answered.      Jeremiah Blazing, MD Gastroenterology and Hepatology [facility] Gastroenterology

## 2023-02-04 ENCOUNTER — Telehealth: Payer: Self-pay | Admitting: Internal Medicine

## 2023-02-04 NOTE — Telephone Encounter (Signed)
Contacted Jeremiah Osborn to schedule their annual wellness visit. Appointment made for 02/17/2023.  Gabriel Cirri [facility] AWV TEAM Direct Dial [phone]

## 2023-02-04 NOTE — Telephone Encounter (Signed)
Copied from CRM [id number]. Topic: Medicare AWV >> Feb 04, 2023  3:15 PM Gwenith Spitz wrote: Reason for CRM: Called patient to schedule Medicare Annual Wellness Visit (AWV). Left message for patient to call back and schedule Medicare Annual Wellness Visit (AWV).  Last date of AWV: N/A  Please schedule an appointment at any time with Inetta Fermo, [facility]. Please schedule AWVI with Inetta Fermo, [facility].  If any questions, please contact me at [phone].  Thank you ,  Gabriel Cirri [facility] AWV TEAM Direct Dial [phone]

## 2023-02-17 ENCOUNTER — Ambulatory Visit (INDEPENDENT_AMBULATORY_CARE_PROVIDER_SITE_OTHER): Payer: BLUE CROSS/BLUE SHIELD

## 2023-02-17 VITALS — Wt 194.0 lb

## 2023-02-17 DIAGNOSIS — Z0000 Encounter for general adult medical examination without abnormal findings: Secondary | ICD-10-CM

## 2023-02-17 NOTE — Progress Notes (Signed)
I connected with  Jeremiah Osborn on 02/17/23 by a audio enabled telemedicine application and verified that I am speaking with the correct person using two identifiers.  Patient Location: Home  Provider Location: Office/Clinic  I discussed the limitations of evaluation and management by telemedicine. The patient expressed understanding and agreed to proceed.     Patient Medicare AWV questionnaire was completed by the patient on 02/13/23; I have confirmed that all information answered by patient is correct and no changes since this date.      Subjective:   Jeremiah Osborn is a 67 y.o. male who presents for an Initial Medicare Annual Wellness Visit.  Review of Systems     Cardiac Risk Factors include: advanced age (>22men, >39 women);dyslipidemia;hypertension;male gender     Objective:    Today's Vitals   02/17/23 0935  Weight: 194 lb (88 kg)   Body mass index is 25.6 kg/m.     02/17/2023    9:38 AM 05/27/2017   11:04 AM 05/27/2017   10:20 AM  Advanced Directives  Does Patient Have a Medical Advance Directive? Yes Yes Yes  Type of Estate agent of [place];Living will Living will Living will  Copy of Healthcare Power of Attorney in Chart? No - copy requested      Current Medications (verified) Outpatient Encounter Medications as of 02/17/2023  Medication Sig   amLODipine (NORVASC) 5 MG tablet Take 1 tablet (5 mg total) by mouth daily.   desipramine (NORPRAMIN) 50 MG tablet Take 1 tablet (50 mg total) by mouth at bedtime.   propranolol (INDERAL) 80 MG tablet Take 1 tablet (80 mg total) by mouth daily.   rosuvastatin (CRESTOR) 20 MG tablet Take 1 tablet (20 mg total) by mouth at bedtime.   No facility-administered encounter medications on file as of 02/17/2023.    Allergies (verified) Patient has no known allergies.   History: Past Medical History:  Diagnosis Date   Depression with anxiety    Hyperlipidemia, acquired 11/18/2022   Lipid Panel  No  results found for: "CHOL", "TRIG", "HDL", "CHOLHDL", "VLDL", "LDLCALC", "LDLDIRECT", "LABVLDL"    Hypertension    Major depressive disorder, single episode, in remission (HCC) 11/18/2022   Long term desipramine since being in his 28s   Prostate cancer (HCC)    Traumatic brain injury [facility])    Past Surgical History:  Procedure Laterality Date   CATARACT EXTRACTION     EYE SURGERY  2015   PROSTATE SURGERY  2010   removed   Family History  Problem Relation Age of Onset   Heart disease Mother    Cancer Father        bowel duct cancer   Social History   Socioeconomic History   Marital status: Married    Spouse name: Not on file   Number of children: 1   Years of education: 1 year college   Highest education level: Not on file  Occupational History   Not on file  Tobacco Use   Smoking status: Former    Types: Cigarettes    Quit date: 10/07/1982    Years since quitting: 40.3   Smokeless tobacco: Never  Vaping Use   Vaping Use: Never used  Substance and Sexual Activity   Alcohol use: No    Comment: Quit 1978   Drug use: Never   Sexual activity: Yes    Birth control/protection: Surgical    Comment: Prostate removal 2010  Other Topics Concern   Not on file  Social History Narrative   Lives at home with his wife.   Right-handed.   1-2 cups caffeine daily.   Social Determinants of Health   Financial Resource Strain: Low Risk  (02/13/2023)   Overall Financial Resource Strain (CARDIA)    Difficulty of Paying Living Expenses: Not hard at all  Food Insecurity: No Food Insecurity (02/13/2023)   Hunger Vital Sign    Worried About Running Out of Food in the Last Year: Never true    Ran Out of Food in the Last Year: Never true  Transportation Needs: No Transportation Needs (02/13/2023)   PRAPARE - Administrator, Civil Service (Medical): No    Lack of Transportation (Non-Medical): No  Physical Activity: Sufficiently Active (02/13/2023)   Exercise Vital Sign    Days of  Exercise per Week: 3 days    Minutes of Exercise per Session: 150+ min  Stress: No Stress Concern Present (02/13/2023)   [employer] of Occupational Health - Occupational Stress Questionnaire    Feeling of Stress : Not at all  Social Connections: Socially Integrated (02/13/2023)   Social Connection and Isolation Panel [NHANES]    Frequency of Communication with Friends and Family: Three times a week    Frequency of Social Gatherings with Friends and Family: Twice a week    Attends Religious Services: More than 4 times per year    Active Member of [employer] or Organizations: Yes    Attends Engineer, structural: More than 4 times per year    Marital Status: Married    Tobacco Counseling Counseling given: Not Answered   Clinical Intake:  Pre-visit preparation completed: Yes  Pain : No/denies pain     BMI - recorded: 25.6 Nutritional Status: BMI 25 -29 Overweight Nutritional Risks: None Diabetes: No  How often do you need to have someone help you when you read instructions, pamphlets, or other written materials from your doctor or pharmacy?: (P) 2 - Rarely  Diabetic?no  Interpreter Needed?: No  Information entered by :: Lanier Ensign, LPN   Activities of Daily Living    02/13/2023    9:06 AM  In your present state of health, do you have any difficulty performing the following activities:  Hearing? 0  Vision? 0  Difficulty concentrating or making decisions? 0  Walking or climbing stairs? 1  Comment not as stable  Dressing or bathing? 0  Doing errands, shopping? 0  Preparing Food and eating ? N  Using the Toilet? N  In the past six months, have you accidently leaked urine? Y  Comment wears a pad  Do you have problems with loss of bowel control? N  Managing your Medications? N  Managing your Finances? N  Housekeeping or managing your Housekeeping? N    Patient Care Team: [name], MD as PCP - General (Internal Medicine) Cassandria Anger, MD as  Referring Physician (Physical Medicine and Rehabilitation) Marguerita Merles, Reuel Boom, MD as Consulting Physician (Gastroenterology)  Indicate any recent Medical Services you may have received from other than [facility] providers in the past year (date may be approximate).     Assessment:   This is a routine wellness examination for Zyron.  Hearing/Vision screen Hearing Screening - Comments:: Pt denies any hearing issues  Vision Screening - Comments:: Encourage to follow up with provider   Dietary issues and exercise activities discussed: Current Exercise Habits: Home exercise routine, Type of exercise: Other - see comments, Time (Minutes): > 60, Frequency (Times/Week): 3, Weekly Exercise (  Minutes/Week): 0   Goals Addressed             This Visit's Progress    Patient Stated       Live long and healthy        Depression Screen    02/17/2023    9:37 AM 11/18/2022   10:23 AM  PHQ 2/9 Scores  PHQ - 2 Score 0 1  PHQ- 9 Score  2    Fall Risk    02/13/2023    9:06 AM 11/18/2022   10:23 AM  Fall Risk   Falls in the past year? 0 0  Number falls in past yr: 0 0  Injury with Fall? 0 0  Risk for fall due to : Impaired vision No Fall Risks  Follow up Falls prevention discussed Falls evaluation completed    FALL RISK PREVENTION PERTAINING TO THE HOME:  Any stairs in or around the home? No  If so, are there any without handrails? No  Home free of loose throw rugs in walkways, pet beds, electrical cords, etc? Yes  Adequate lighting in your home to reduce risk of falls? Yes   ASSISTIVE DEVICES UTILIZED TO PREVENT FALLS:  Life alert? No  Use of a cane, walker or w/c? No  Grab bars in the bathroom? Yes  Shower chair or bench in shower? Yes  Elevated toilet seat or a handicapped toilet? No   TIMED UP AND GO:  Was the test performed? No .   Cognitive Function:    06/10/2017   10:44 AM  MMSE - Mini Mental State Exam  Orientation to time 5  Orientation to Place 5  Registration  3  Attention/ Calculation 5  Recall 2  Language- name 2 objects 2  Language- repeat 1  Language- follow 3 step command 3  Language- read & follow direction 1  Write a sentence 1  Copy design 1  Total score 29        02/17/2023    9:40 AM  6CIT Screen  What Year? 0 points  What month? 0 points  What time? 0 points  Count back from 20 0 points  Months in reverse 0 points  Repeat phrase 0 points  Total Score 0 points    Immunizations  There is no immunization history on file for this patient.  TDAP status: Due, Education has been provided regarding the importance of this vaccine. Advised may receive this vaccine at local pharmacy or Health Dept. Aware to provide a copy of the vaccination record if obtained from local pharmacy or Health Dept. Verbalized acceptance and understanding.  Flu Vaccine status: Declined, Education has been provided regarding the importance of this vaccine but patient still declined. Advised may receive this vaccine at local pharmacy or Health Dept. Aware to provide a copy of the vaccination record if obtained from local pharmacy or Health Dept. Verbalized acceptance and understanding.  Pneumococcal vaccine status: Declined,  Education has been provided regarding the importance of this vaccine but patient still declined. Advised may receive this vaccine at local pharmacy or Health Dept. Aware to provide a copy of the vaccination record if obtained from local pharmacy or Health Dept. Verbalized acceptance and understanding.   Covid-19 vaccine status: Declined, Education has been provided regarding the importance of this vaccine but patient still declined. Advised may receive this vaccine at local pharmacy or Health Dept.or vaccine clinic. Aware to provide a copy of the vaccination record if obtained from local pharmacy or Health Dept.  Verbalized acceptance and understanding.  Qualifies for Shingles Vaccine? Yes   Zostavax completed No   Shingrix Completed?:  No.    Education has been provided regarding the importance of this vaccine. Patient has been advised to call insurance company to determine out of pocket expense if they have not yet received this vaccine. Advised may also receive vaccine at local pharmacy or Health Dept. Verbalized acceptance and understanding.  Screening Tests Health Maintenance  Topic Date Due   COLONOSCOPY (Pts 45-50yrs Insurance coverage will need to be confirmed)  06/07/2023 (Originally 08/16/1999)   INFLUENZA VACCINE  05/08/2023   Medicare Annual Wellness (AWV)  02/17/2024   HPV VACCINES  Aged Out   DTaP/Tdap/Td  Discontinued   Pneumonia Vaccine 82+ Years old  Discontinued   COVID-19 Vaccine  Discontinued   Hepatitis C Screening  Discontinued   Zoster Vaccines- Shingrix  Discontinued    Health Maintenance  There are no preventive care reminders to display for this patient.   Colorectal cancer screening: Referral to GI placed scheduled 02/26/23. Pt aware the office will call re: appt.   Additional Screening:  Hepatitis C Screening: does not qualify  Vision Screening: Recommended annual ophthalmology exams for early detection of glaucoma and other disorders of the eye. Is the patient up to date with their annual eye exam?  No  Who is the provider or what is the name of the office in which the patient attends annual eye exams? Encouraged to follow up with provider  If pt is not established with a provider, would they like to be referred to a provider to establish care? No .   Dental Screening: Recommended annual dental exams for proper oral hygiene  Community Resource Referral / Chronic Care Management: CRR required this visit?  No   CCM required this visit?  No      Plan:     I have personally reviewed and noted the following in the patient's chart:   Medical and social history Use of alcohol, tobacco or illicit drugs  Current medications and supplements including opioid prescriptions. Patient is  not currently taking opioid prescriptions. Functional ability and status Nutritional status Physical activity Advanced directives List of other physicians Hospitalizations, surgeries, and ER visits in previous 12 months Vitals Screenings to include cognitive, depression, and falls Referrals and appointments  In addition, I have reviewed and discussed with patient certain preventive protocols, quality metrics, and best practice recommendations. A written personalized care plan for preventive services as well as general preventive health recommendations were provided to patient.     Marzella Schlein, LPN   1/61/0960   Nurse Notes: none

## 2023-02-17 NOTE — Patient Instructions (Signed)
Jeremiah Osborn , Thank you for taking time to come for your Medicare Wellness Visit. I appreciate your ongoing commitment to your health goals. Please review the following plan we discussed and let me know if I can assist you in the future.   These are the goals we discussed:  Goals      Patient Stated     Live long and healthy         This is a list of the screening recommended for you and due dates:  Health Maintenance  Topic Date Due   Colon Cancer Screening  06/07/2023*   Flu Shot  05/08/2023   Medicare Annual Wellness Visit  02/17/2024   HPV Vaccine  Aged Out   DTaP/Tdap/Td vaccine  Discontinued   Pneumonia Vaccine  Discontinued   COVID-19 Vaccine  Discontinued   Hepatitis C Screening: USPSTF Recommendation to screen - Ages 69-69 yo.  Discontinued   Zoster (Shingles) Vaccine  Discontinued  *Topic was postponed. The date shown is not the original due date.    Advanced directives: Please bring a copy of your health care power of attorney and living will to the office at your convenience.  Conditions/risks identified: live long and stay healthy   Next appointment: Follow up in one year for your annual wellness visit.   Preventive Care 69 Years and Older, Male  Preventive care refers to lifestyle choices and visits with your health care provider that can promote health and wellness. What does preventive care include? A yearly physical exam. This is also called an annual well check. Dental exams once or twice a year. Routine eye exams. Ask your health care provider how often you should have your eyes checked. Personal lifestyle choices, including: Daily care of your teeth and gums. Regular physical activity. Eating a healthy diet. Avoiding tobacco and drug use. Limiting alcohol use. Practicing safe sex. Taking low doses of aspirin every day. Taking vitamin and mineral supplements as recommended by your health care provider. What happens during an annual well check? The  services and screenings done by your health care provider during your annual well check will depend on your age, overall health, lifestyle risk factors, and family history of disease. Counseling  Your health care provider may ask you questions about your: Alcohol use. Tobacco use. Drug use. Emotional well-being. Home and relationship well-being. Sexual activity. Eating habits. History of falls. Memory and ability to understand (cognition). Work and work Astronomer. Screening  You may have the following tests or measurements: Height, weight, and BMI. Blood pressure. Lipid and cholesterol levels. These may be checked every 5 years, or more frequently if you are over 69 years old. Skin check. Lung cancer screening. You may have this screening every year starting at age 55 if you have a 30-pack-year history of smoking and currently smoke or have quit within the past 15 years. Fecal occult blood test (FOBT) of the stool. You may have this test every year starting at age 69. Flexible sigmoidoscopy or colonoscopy. You may have a sigmoidoscopy every 5 years or a colonoscopy every 10 years starting at age 69. Prostate cancer screening. Recommendations will vary depending on your family history and other risks. Hepatitis C blood test. Hepatitis B blood test. Sexually transmitted disease (STD) testing. Diabetes screening. This is done by checking your blood sugar (glucose) after you have not eaten for a while (fasting). You may have this done every 1-3 years. Abdominal aortic aneurysm (AAA) screening. You may need this if you are  a current or former smoker. Osteoporosis. You may be screened starting at age 69 if you are at high risk. Talk with your health care provider about your test results, treatment options, and if necessary, the need for more tests. Vaccines  Your health care provider may recommend certain vaccines, such as: Influenza vaccine. This is recommended every year. Tetanus,  diphtheria, and acellular pertussis (Tdap, Td) vaccine. You may need a Td booster every 10 years. Zoster vaccine. You may need this after age 69. Pneumococcal 13-valent conjugate (PCV13) vaccine. One dose is recommended after age 65. Pneumococcal polysaccharide (PPSV23) vaccine. One dose is recommended after age 69. Talk to your health care provider about which screenings and vaccines you need and how often you need them. This information is not intended to replace advice given to you by your health care provider. Make sure you discuss any questions you have with your health care provider. Document Released: 10/20/2015 Document Revised: 06/12/2016 Document Reviewed: 07/25/2015 Elsevier Interactive Patient Education  2017 [employer].  Fall Prevention in the Home Falls can cause injuries. They can happen to people of all ages. There are many things you can do to make your home safe and to help prevent falls. What can I do on the outside of my home? Regularly fix the edges of walkways and driveways and fix any cracks. Remove anything that might make you trip as you walk through a door, such as a raised step or threshold. Trim any bushes or trees on the path to your home. Use bright outdoor lighting. Clear any walking paths of anything that might make someone trip, such as rocks or tools. Regularly check to see if handrails are loose or broken. Make sure that both sides of any steps have handrails. Any raised decks and porches should have guardrails on the edges. Have any leaves, snow, or ice cleared regularly. Use sand or salt on walking paths during winter. Clean up any spills in your garage right away. This includes oil or grease spills. What can I do in the bathroom? Use night lights. Install grab bars by the toilet and in the tub and shower. Do not use towel bars as grab bars. Use non-skid mats or decals in the tub or shower. If you need to sit down in the shower, use a plastic,  non-slip stool. Keep the floor dry. Clean up any water that spills on the floor as soon as it happens. Remove soap buildup in the tub or shower regularly. Attach bath mats securely with double-sided non-slip rug tape. Do not have throw rugs and other things on the floor that can make you trip. What can I do in the bedroom? Use night lights. Make sure that you have a light by your bed that is easy to reach. Do not use any sheets or blankets that are too big for your bed. They should not hang down onto the floor. Have a firm chair that has side arms. You can use this for support while you get dressed. Do not have throw rugs and other things on the floor that can make you trip. What can I do in the kitchen? Clean up any spills right away. Avoid walking on wet floors. Keep items that you use a lot in easy-to-reach places. If you need to reach something above you, use a strong step stool that has a grab bar. Keep electrical cords out of the way. Do not use floor polish or wax that makes floors slippery. If you must  use wax, use non-skid floor wax. Do not have throw rugs and other things on the floor that can make you trip. What can I do with my stairs? Do not leave any items on the stairs. Make sure that there are handrails on both sides of the stairs and use them. Fix handrails that are broken or loose. Make sure that handrails are as long as the stairways. Check any carpeting to make sure that it is firmly attached to the stairs. Fix any carpet that is loose or worn. Avoid having throw rugs at the top or bottom of the stairs. If you do have throw rugs, attach them to the floor with carpet tape. Make sure that you have a light switch at the top of the stairs and the bottom of the stairs. If you do not have them, ask someone to add them for you. What else can I do to help prevent falls? Wear shoes that: Do not have high heels. Have rubber bottoms. Are comfortable and fit you well. Are closed  at the toe. Do not wear sandals. If you use a stepladder: Make sure that it is fully opened. Do not climb a closed stepladder. Make sure that both sides of the stepladder are locked into place. Ask someone to hold it for you, if possible. Clearly mark and make sure that you can see: Any grab bars or handrails. First and last steps. Where the edge of each step is. Use tools that help you move around (mobility aids) if they are needed. These include: Canes. Walkers. Scooters. Crutches. Turn on the lights when you go into a dark area. Replace any light bulbs as soon as they burn out. Set up your furniture so you have a clear path. Avoid moving your furniture around. If any of your floors are uneven, fix them. If there are any pets around you, be aware of where they are. Review your medicines with your doctor. Some medicines can make you feel dizzy. This can increase your chance of falling. Ask your doctor what other things that you can do to help prevent falls. This information is not intended to replace advice given to you by your health care provider. Make sure you discuss any questions you have with your health care provider. Document Released: 07/20/2009 Document Revised: 02/29/2016 Document Reviewed: 10/28/2014 Elsevier Interactive Patient Education  2017 [employer].

## 2023-02-21 ENCOUNTER — Other Ambulatory Visit: Payer: Self-pay

## 2023-02-21 DIAGNOSIS — I10 Essential (primary) hypertension: Secondary | ICD-10-CM

## 2023-02-21 DIAGNOSIS — F325 Major depressive disorder, single episode, in full remission: Secondary | ICD-10-CM

## 2023-02-21 MED ORDER — ROSUVASTATIN CALCIUM 20 MG PO TABS
20.0000 mg | ORAL_TABLET | Freq: Every day | ORAL | 0 refills | Status: DC
  Filled 2023-02-21: qty 30, 30d supply, fill #0

## 2023-02-21 MED ORDER — PROPRANOLOL HCL 80 MG PO TABS
80.0000 mg | ORAL_TABLET | Freq: Every day | ORAL | 0 refills | Status: DC
Start: 2023-02-21 — End: 2023-06-20
  Filled 2023-02-21: qty 30, 30d supply, fill #0

## 2023-02-21 MED ORDER — DESIPRAMINE HCL 50 MG PO TABS
50.0000 mg | ORAL_TABLET | Freq: Every day | ORAL | 0 refills | Status: DC
  Filled 2023-02-21: qty 30, 30d supply, fill #0

## 2023-02-21 MED ORDER — AMLODIPINE BESYLATE 5 MG PO TABS
5.0000 mg | ORAL_TABLET | Freq: Every day | ORAL | 0 refills | Status: DC
  Filled 2023-02-21: qty 30, 30d supply, fill #0

## 2023-02-21 NOTE — Telephone Encounter (Signed)
Patient states all future RX's and refills will need to be sent to Patient's Preferred Pharmacy:  [id number] - [place] Otto, [place] - [address] AT [facility] [address] [facility] [address] Phone: [phone]  Fax: [phone]

## 2023-02-21 NOTE — Telephone Encounter (Signed)
Rx sent 

## 2023-02-24 ENCOUNTER — Other Ambulatory Visit: Payer: Self-pay

## 2023-02-25 ENCOUNTER — Encounter (HOSPITAL_COMMUNITY): Payer: Self-pay | Admitting: Certified Registered Nurse Anesthetist

## 2023-02-25 ENCOUNTER — Telehealth (INDEPENDENT_AMBULATORY_CARE_PROVIDER_SITE_OTHER): Payer: Self-pay | Admitting: *Deleted

## 2023-02-25 NOTE — Telephone Encounter (Signed)
Pt called to see when he was suppose to start drinking prep. I called him back and he ate lunch today and was suppose to be on liquids today. I let him know he would need to be rescheduled. He also mixed his prep solution and wanted to know if he would need new solution. I told him scheduler would reach out to him to get him canceled for tomorrow and rescheduled.   [phone]

## 2023-02-26 ENCOUNTER — Ambulatory Visit (HOSPITAL_COMMUNITY): Admission: RE | Admit: 2023-02-26 | Payer: Medicare Other | Source: Ambulatory Visit | Admitting: Gastroenterology

## 2023-02-26 ENCOUNTER — Encounter (HOSPITAL_COMMUNITY): Admission: RE | Payer: Self-pay | Source: Ambulatory Visit

## 2023-02-26 DIAGNOSIS — R195 Other fecal abnormalities: Secondary | ICD-10-CM

## 2023-02-26 SURGERY — COLONOSCOPY WITH PROPOFOL
Anesthesia: Monitor Anesthesia Care

## 2023-02-26 MED ORDER — PEG 3350-KCL-NA BICARB-NACL 420 G PO SOLR: 4000.0000 mL | Freq: Once | ORAL | 0 refills | Status: AC

## 2023-02-26 NOTE — Telephone Encounter (Signed)
Left message to return call (ASA 1-2)(Positive Cologuard)

## 2023-02-26 NOTE — Telephone Encounter (Signed)
Pt returned call. Pt rescheduled for 04/04/23 at 9:15am. New instructions mailed to pt.

## 2023-03-07 ENCOUNTER — Other Ambulatory Visit: Payer: Self-pay

## 2023-04-04 ENCOUNTER — Other Ambulatory Visit: Payer: Self-pay

## 2023-04-04 ENCOUNTER — Encounter (HOSPITAL_COMMUNITY): Payer: Self-pay | Admitting: Gastroenterology

## 2023-04-04 ENCOUNTER — Ambulatory Visit (HOSPITAL_COMMUNITY)
Admission: RE | Admit: 2023-04-04 | Discharge: 2023-04-04 | Disposition: A | Discharge status: Home / Self Care | Payer: Medicare Other | Source: Ambulatory Visit | Attending: Gastroenterology | Admitting: Gastroenterology

## 2023-04-04 ENCOUNTER — Encounter (HOSPITAL_COMMUNITY)
Admission: RE | Disposition: A | Discharge status: Home / Self Care | Payer: Self-pay | Source: Ambulatory Visit | Attending: Gastroenterology

## 2023-04-04 ENCOUNTER — Ambulatory Visit (HOSPITAL_BASED_OUTPATIENT_CLINIC_OR_DEPARTMENT_OTHER): Payer: Medicare Other | Admitting: Certified Registered Nurse Anesthetist

## 2023-04-04 ENCOUNTER — Ambulatory Visit (HOSPITAL_COMMUNITY): Payer: Medicare Other | Admitting: Certified Registered Nurse Anesthetist

## 2023-04-04 DIAGNOSIS — D125 Benign neoplasm of sigmoid colon: Secondary | ICD-10-CM

## 2023-04-04 DIAGNOSIS — K648 Other hemorrhoids: Secondary | ICD-10-CM | POA: Insufficient documentation

## 2023-04-04 DIAGNOSIS — K635 Polyp of colon: Secondary | ICD-10-CM | POA: Diagnosis not present

## 2023-04-04 DIAGNOSIS — I10 Essential (primary) hypertension: Secondary | ICD-10-CM | POA: Diagnosis not present

## 2023-04-04 DIAGNOSIS — Z1211 Encounter for screening for malignant neoplasm of colon: Secondary | ICD-10-CM | POA: Diagnosis present

## 2023-04-04 DIAGNOSIS — D123 Benign neoplasm of transverse colon: Secondary | ICD-10-CM

## 2023-04-04 DIAGNOSIS — D124 Benign neoplasm of descending colon: Secondary | ICD-10-CM | POA: Diagnosis not present

## 2023-04-04 DIAGNOSIS — K5730 Diverticulosis of large intestine without perforation or abscess without bleeding: Secondary | ICD-10-CM | POA: Insufficient documentation

## 2023-04-04 DIAGNOSIS — D120 Benign neoplasm of cecum: Secondary | ICD-10-CM | POA: Insufficient documentation

## 2023-04-04 DIAGNOSIS — Z8546 Personal history of malignant neoplasm of prostate: Secondary | ICD-10-CM | POA: Insufficient documentation

## 2023-04-04 DIAGNOSIS — F419 Anxiety disorder, unspecified: Secondary | ICD-10-CM | POA: Insufficient documentation

## 2023-04-04 DIAGNOSIS — Z87891 Personal history of nicotine dependence: Secondary | ICD-10-CM | POA: Diagnosis not present

## 2023-04-04 DIAGNOSIS — F32A Depression, unspecified: Secondary | ICD-10-CM | POA: Insufficient documentation

## 2023-04-04 DIAGNOSIS — K5140 Inflammatory polyps of colon without complications: Secondary | ICD-10-CM | POA: Insufficient documentation

## 2023-04-04 DIAGNOSIS — R195 Other fecal abnormalities: Secondary | ICD-10-CM | POA: Diagnosis not present

## 2023-04-04 DIAGNOSIS — E785 Hyperlipidemia, unspecified: Secondary | ICD-10-CM | POA: Diagnosis not present

## 2023-04-04 DIAGNOSIS — Z1212 Encounter for screening for malignant neoplasm of rectum: Secondary | ICD-10-CM

## 2023-04-04 HISTORY — PX: HEMOSTASIS CLIP PLACEMENT: SHX6857

## 2023-04-04 HISTORY — PX: COLONOSCOPY WITH PROPOFOL: SHX5780

## 2023-04-04 HISTORY — PX: POLYPECTOMY: SHX5525

## 2023-04-04 HISTORY — PX: ENDOSCOPIC MUCOSAL RESECTION: SHX6839

## 2023-04-04 HISTORY — PX: SUBMUCOSAL TATTOO INJECTION: SHX6856

## 2023-04-04 LAB — HM COLONOSCOPY

## 2023-04-04 SURGERY — COLONOSCOPY WITH PROPOFOL
Anesthesia: General

## 2023-04-04 MED ORDER — PROPOFOL 500 MG/50ML IV EMUL
INTRAVENOUS | Status: AC
  Filled 2023-04-04: qty 50

## 2023-04-04 MED ORDER — SPOT INK MARKER SYRINGE KIT
PACK | SUBMUCOSAL | Status: DC | PRN
  Administered 2023-04-04: 1 mL via SUBMUCOSAL

## 2023-04-04 MED ORDER — LACTATED RINGERS IV SOLN: INTRAVENOUS | Status: DC | PRN

## 2023-04-04 MED ORDER — SPOT INK MARKER SYRINGE KIT
PACK | SUBMUCOSAL | Status: AC
  Filled 2023-04-04: qty 5

## 2023-04-04 MED ORDER — PROPOFOL 500 MG/50ML IV EMUL
INTRAVENOUS | Status: DC | PRN
  Administered 2023-04-04: 150 ug/kg/min via INTRAVENOUS

## 2023-04-04 MED ORDER — PROPOFOL 10 MG/ML IV BOLUS
INTRAVENOUS | Status: DC | PRN
  Administered 2023-04-04: 60 mg via INTRAVENOUS

## 2023-04-04 NOTE — H&P (Signed)
Jeremiah Osborn is an 67 y.o. male.   Chief Complaint: positive Cologuard HPI: Jeremiah Osborn is a 67 y.o. male with past medical history of hyperlipidemia, hypertension, depression, prostate cancer, who presents for evaluation of Positive Cologuard.   The patient denies having any nausea, vomiting, fever, chills, hematochezia, melena, hematemesis, abdominal distention, abdominal pain, diarrhea, jaundice, pruritus or weight loss.  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 His smoking use included cigarettes. He has never used smokeless tobacco. He reports that he does not drink alcohol and does not use drugs.  Allergies: No Known Allergies  Medications Prior to Admission  Medication Sig Dispense Refill   amLODipine (NORVASC) 5 MG tablet Take 1 tablet (5 mg total) by mouth daily. 90 tablet 0   desipramine (NORPRAMIN) 50 MG tablet Take 1 tablet (50 mg total) by mouth at bedtime. 90 tablet 0   Multiple Vitamins-Minerals (MULTIVITAMIN WITH MINERALS) tablet Take 1 tablet by mouth daily. Centrum Silver Multivitamin     propranolol (INDERAL) 80 MG tablet Take 1 tablet (80 mg total) by mouth daily. (Patient taking differently: Take 80 mg by mouth at bedtime.) 90 tablet 0   rosuvastatin (CRESTOR) 20  MG tablet Take 1 tablet (20 mg total) by mouth at bedtime. 90 tablet 0    No results found for this or any previous visit (from the past 48 hour(s)). No results found.  Review of Systems  All other systems reviewed and are negative.   Blood pressure 132/81, pulse 77, temperature 97.9 F (36.6 C), temperature source Oral, resp. rate 18, height 6\' 1"  (1.854 m), weight 88.5 kg, SpO2 97 %. Physical Exam  GENERAL: The patient is AO x3, in no acute distress. HEENT: Head is normocephalic and atraumatic. EOMI are intact. Mouth is well hydrated and without lesions. NECK: Supple. No masses LUNGS: Clear to auscultation. No presence of rhonchi/wheezing/rales. Adequate chest expansion HEART: RRR, normal s1 and s2. ABDOMEN: Soft, nontender, no guarding, no peritoneal signs, and nondistended. BS +. No masses. EXTREMITIES: Without any cyanosis, clubbing, rash, lesions or edema. NEUROLOGIC: AOx3, no focal motor deficit. SKIN: no jaundice, no rashes  Assessment/Plan Jeremiah Osborn is a 67 y.o. male with past medical history of hyperlipidemia, hypertension, depression, prostate cancer, who presents for evaluation of Positive Cologuard. Will proceed with colonoscopy.  Dolores Frame, MD 04/04/2023, 8:55 AM

## 2023-04-04 NOTE — Transfer of Care (Signed)
Immediate Anesthesia Transfer of Care Note  Patient: Jeremiah Osborn  Procedure(s) Performed: COLONOSCOPY WITH PROPOFOL POLYPECTOMY SUBMUCOSAL TATTOO INJECTION HEMOSTASIS CLIP PLACEMENT  Patient Location: Endoscopy Unit  Anesthesia Type:General  Level of Consciousness: awake, alert , and oriented  Airway & Oxygen Therapy: Patient Spontanous Breathing  Post-op Assessment: Report given to RN, Post -op Vital signs reviewed and stable, Patient moving all extremities X 4, and Patient able to stick tongue midline  Post vital signs: Reviewed  Last Vitals:  Vitals Value Taken Time  BP 95/61 04/04/23 1047  Temp 36.4 C 04/04/23 1047  Pulse 73 04/04/23 1047  Resp 22 04/04/23 1047  SpO2 94 % 04/04/23 1047    Last Pain:  Vitals:   04/04/23 1047  TempSrc: Axillary  PainSc: 0-No pain      Patients Stated Pain Goal: 8 (04/04/23 1047)  Complications: No notable events documented.

## 2023-04-04 NOTE — Anesthesia Preprocedure Evaluation (Signed)
Anesthesia Evaluation  Patient identified by MRN, date of birth, ID band Patient awake    Reviewed: Allergy & Precautions, H&P , NPO status , Patient's Chart, lab work & pertinent test results, reviewed documented beta blocker date and time   Airway Mallampati: II  TM Distance: >3 FB Neck ROM: full    Dental no notable dental hx.    Pulmonary neg pulmonary ROS, former smoker   Pulmonary exam normal breath sounds clear to auscultation       Cardiovascular Exercise Tolerance: Good hypertension, negative cardio ROS  Rhythm:regular Rate:Normal     Neuro/Psych  PSYCHIATRIC DISORDERS Anxiety Depression    negative neurological ROS  negative psych ROS   GI/Hepatic negative GI ROS, Neg liver ROS,,,  Endo/Other  negative endocrine ROS    Renal/GU negative Renal ROS  negative genitourinary   Musculoskeletal   Abdominal   Peds  Hematology negative hematology ROS (+)   Anesthesia Other Findings   Reproductive/Obstetrics negative OB ROS                             Anesthesia Physical Anesthesia Plan  ASA: 3  Anesthesia Plan: General   Post-op Pain Management:    Induction:   PONV Risk Score and Plan: Propofol infusion  Airway Management Planned:   Additional Equipment:   Intra-op Plan:   Post-operative Plan:   Informed Consent: I have reviewed the patients History and Physical, chart, labs and discussed the procedure including the risks, benefits and alternatives for the proposed anesthesia with the patient or authorized representative who has indicated his/her understanding and acceptance.     Dental Advisory Given  Plan Discussed with: CRNA  Anesthesia Plan Comments:        Anesthesia Quick Evaluation

## 2023-04-04 NOTE — Discharge Instructions (Signed)
You are being discharged to home.  Resume your previous diet.  We are waiting for your pathology results.  Your physician has recommended a repeat colonoscopy for surveillance based on pathology results.  

## 2023-04-04 NOTE — Op Note (Signed)
[facility] Patient Name: Jeremiah Osborn Procedure Date: 04/04/2023 9:53 AM MRN: [id number] Date of Birth: [birth year] Attending MD: [name] , , [id number] CSN: [id number] Age: 67 Admit Type: Outpatient Procedure:                Colonoscopy Indications:              Positive Cologuard test Providers:                [name], Buel Ream. Thomasena Edis RN, RN,                            Zena Amos Referring MD:              Medicines:                Monitored Anesthesia Care Complications:            No immediate complications. Estimated Blood Loss:     Estimated blood loss: none. Procedure:                Pre-Anesthesia Assessment:                           - Prior to the procedure, a History and Physical                            was performed, and patient medications, allergies                            and sensitivities were reviewed. The patient's                            tolerance of previous anesthesia was reviewed.                           - The risks and benefits of the procedure and the                            sedation options and risks were discussed with the                            patient. All questions were answered and informed                            consent was obtained.                           - ASA Grade Assessment: II - A patient with mild                            systemic disease.                           After obtaining informed consent, the colonoscope                            was passed under direct vision. Throughout the  procedure, the patient's blood pressure, pulse, and                            oxygen saturations were monitored continuously. The                            [id number]-HQ190L ([id number]) scope was introduced through                            the anus and advanced to the the cecum, identified                            by appendiceal orifice and ileocecal valve. The                             colonoscopy was performed without difficulty. The                            patient tolerated the procedure well. The quality                            of the bowel preparation was good. Scope In: 10:07:28 AM Scope Out: 10:44:09 AM Scope Withdrawal Time: 0 hours 32 minutes 12 seconds  Total Procedure Duration: 0 hours 36 minutes 41 seconds  Findings:      The perianal and digital rectal examinations were normal.      Three sessile and semi-pedunculated polyps were found in the transverse       colon, hepatic flexure and cecum. The polyps were 4 to 8 mm in size.       These polyps were removed with a cold snare. Resection and retrieval       were complete.      A 1 mm polyp was found in the transverse colon. The polyp was sessile.       The polyp was removed with a cold biopsy forceps. Resection and       retrieval were complete.      A 5 mm polyp was found in the descending colon. The polyp was sessile.       The polyp was removed with a cold snare. Resection and retrieval were       complete.      A 10 mm granular polyp was found in the sigmoid colon. The polyp was       multi-lobulated. The polyp was removed with a hot snare. Resection and       retrieval were complete. To prevent bleeding after the polypectomy, one       hemostatic clip was successfully placed. Clip manufacturer: [employer]. There was no bleeding at the end of the procedure. Area was       tattooed with an injection of 1 mL of Spot (carbon black).      Scattered large-mouthed and small-mouthed diverticula were found in the       sigmoid colon, descending colon and ascending colon.      Non-bleeding internal hemorrhoids were found during retroflexion. The       hemorrhoids were small. Impression:               -  Three 4 to 8 mm polyps in the transverse colon,                            at the hepatic flexure and in the cecum, removed                            with a cold snare. Resected and  retrieved.                           - One 1 mm polyp in the transverse colon, removed                            with a cold biopsy forceps. Resected and retrieved.                           - One 5 mm polyp in the descending colon, removed                            with a cold snare. Resected and retrieved.                           - One 10 mm polyp in the sigmoid colon, removed                            with a hot snare. Resected and retrieved. Clip was                            placed. Clip manufacturer: [employer].                            Tattooed.                           - Diverticulosis in the sigmoid colon, in the                            descending colon and in the ascending colon.                           - Non-bleeding internal hemorrhoids. Moderate Sedation:      Per Anesthesia Care Recommendation:           - Discharge patient to home (ambulatory).                           - Resume previous diet.                           - Await pathology results.                           - Repeat colonoscopy for surveillance based on                            pathology  results. Procedure Code(s):        --- Professional ---                           [phone], Colonoscopy, flexible; with removal of                            tumor(s), polyp(s), or other lesion(s) by snare                            technique                           45380, 59, Colonoscopy, flexible; with biopsy,                            single or multiple                           45381, Colonoscopy, flexible; with directed                            submucosal injection(s), any substance Diagnosis Code(s):        --- Professional ---                           D12.3, Benign neoplasm of transverse colon (hepatic                            flexure or splenic flexure)                           D12.0, Benign neoplasm of cecum                           D12.4, Benign neoplasm of descending colon                            D12.5, Benign neoplasm of sigmoid colon                           K64.8, Other hemorrhoids                           R19.5, Other fecal abnormalities                           K57.30, Diverticulosis of large intestine without                            perforation or abscess without bleeding CPT copyright 2022 American Medical Association. All rights reserved. The codes documented in this report are preliminary and upon coder review may  be revised to meet current compliance requirements. [name], MD [name],  04/04/2023 10:50:06 AM This report has been signed electronically. Number of Addenda: 0

## 2023-04-07 ENCOUNTER — Encounter (INDEPENDENT_AMBULATORY_CARE_PROVIDER_SITE_OTHER): Payer: Self-pay | Admitting: *Deleted

## 2023-04-07 LAB — SURGICAL PATHOLOGY

## 2023-04-07 NOTE — Anesthesia Postprocedure Evaluation (Signed)
Anesthesia Post Note  Patient: Jeremiah Osborn  Procedure(s) Performed: COLONOSCOPY WITH PROPOFOL POLYPECTOMY SUBMUCOSAL TATTOO INJECTION HEMOSTASIS CLIP PLACEMENT ENDOSCOPIC MUCOSAL RESECTION  Patient location during evaluation: Phase II Anesthesia Type: General Level of consciousness: awake Pain management: pain level controlled Vital Signs Assessment: post-procedure vital signs reviewed and stable Respiratory status: spontaneous breathing and respiratory function stable Cardiovascular status: blood pressure returned to baseline and stable Postop Assessment: no headache and no apparent nausea or vomiting Anesthetic complications: no Comments: Late entry   No notable events documented.   Last Vitals:  Vitals:   04/04/23 0838 04/04/23 1047  BP: 132/81 95/61  Pulse: 77 73  Resp: 18 (!) 22  Temp: 36.6 C 36.4 C  SpO2: 97% 94%    Last Pain:  Vitals:   04/04/23 1047  TempSrc: Axillary  PainSc: 0-No pain                 Windell Norfolk

## 2023-04-14 ENCOUNTER — Encounter (HOSPITAL_COMMUNITY): Payer: Self-pay | Admitting: Gastroenterology

## 2023-04-15 ENCOUNTER — Encounter: Payer: Self-pay | Admitting: Internal Medicine

## 2023-06-17 ENCOUNTER — Telehealth: Payer: Self-pay | Admitting: Internal Medicine

## 2023-06-17 NOTE — Telephone Encounter (Signed)
Patient states he picked up medications below at [facility] but now the pharmacy is saying the refills have been sent to another pharmacy. Patient is requesting medications below be sent to the [facility] on [address]    Prescription Request  06/17/2023  LOV: 11/18/2022  What is the name of the medication or equipment?   amLODipine (NORVASC) 5 MG tablet  desipramine (NORPRAMIN) 50 MG tablet  propranolol (INDERAL) 80 MG tablet  rosuvastatin (CRESTOR) 20 MG tablet   Have you contacted your pharmacy to request a refill? Yes   Which pharmacy would you like this sent to?  [id number] [place], [place] - [address] AT [facility] [address] [place] [address] Phone: [phone] Fax: [phone]    Patient notified that their request is being sent to the clinical staff for review and that they should receive a response within 2 business days.   Please advise at Mobile [phone] (mobile)

## 2023-06-20 ENCOUNTER — Other Ambulatory Visit: Payer: Self-pay

## 2023-06-20 DIAGNOSIS — F325 Major depressive disorder, single episode, in full remission: Secondary | ICD-10-CM

## 2023-06-20 DIAGNOSIS — I10 Essential (primary) hypertension: Secondary | ICD-10-CM

## 2023-06-20 MED ORDER — ROSUVASTATIN CALCIUM 20 MG PO TABS: 20.0000 mg | ORAL_TABLET | Freq: Every day | ORAL | 0 refills | Status: DC

## 2023-06-20 MED ORDER — AMLODIPINE BESYLATE 5 MG PO TABS
5.0000 mg | ORAL_TABLET | Freq: Every day | ORAL | 0 refills | Status: DC
Start: 2023-06-20 — End: 2024-02-23

## 2023-06-20 MED ORDER — DESIPRAMINE HCL 50 MG PO TABS
50.0000 mg | ORAL_TABLET | Freq: Every day | ORAL | 0 refills | Status: DC
Start: 2023-06-20 — End: 2023-09-20

## 2023-06-20 MED ORDER — PROPRANOLOL HCL 80 MG PO TABS
80.0000 mg | ORAL_TABLET | Freq: Every day | ORAL | 0 refills | Status: DC
Start: 2023-06-20 — End: 2023-09-20

## 2023-06-20 NOTE — Telephone Encounter (Signed)
Sent prescriptions to requested pharmacy.

## 2023-09-19 ENCOUNTER — Other Ambulatory Visit: Payer: Self-pay | Admitting: Internal Medicine

## 2023-09-19 DIAGNOSIS — F325 Major depressive disorder, single episode, in full remission: Secondary | ICD-10-CM

## 2023-09-19 DIAGNOSIS — I10 Essential (primary) hypertension: Secondary | ICD-10-CM

## 2023-11-12 ENCOUNTER — Other Ambulatory Visit: Payer: Self-pay | Admitting: Internal Medicine

## 2023-11-12 DIAGNOSIS — F325 Major depressive disorder, single episode, in full remission: Secondary | ICD-10-CM

## 2023-11-12 NOTE — Telephone Encounter (Signed)
 Patient should have enough refills through around 3/14.

## 2023-12-19 ENCOUNTER — Other Ambulatory Visit: Payer: Self-pay | Admitting: Internal Medicine

## 2023-12-19 DIAGNOSIS — I10 Essential (primary) hypertension: Secondary | ICD-10-CM

## 2024-01-19 ENCOUNTER — Other Ambulatory Visit: Payer: Self-pay | Admitting: Internal Medicine

## 2024-02-09 ENCOUNTER — Telehealth: Payer: Self-pay | Admitting: Internal Medicine

## 2024-02-09 NOTE — Telephone Encounter (Signed)
 Can someone give this patient a call back to schedule CPE w/PCP Dr Boston Byers.. I tried to transfer the patient directly to the office but disconnected call twice...   Thank you ,   [facility] TEAM Direct Dial [phone]

## 2024-02-10 NOTE — Telephone Encounter (Signed)
 Pt scheduled for CPE on 02/23/24 @ 9:20 am. Thank you for informing us .

## 2024-02-16 ENCOUNTER — Encounter (HOSPITAL_COMMUNITY): Payer: Self-pay

## 2024-02-23 ENCOUNTER — Ambulatory Visit (INDEPENDENT_AMBULATORY_CARE_PROVIDER_SITE_OTHER): Payer: BLUE CROSS/BLUE SHIELD

## 2024-02-23 ENCOUNTER — Encounter: Payer: Self-pay | Admitting: Internal Medicine

## 2024-02-23 ENCOUNTER — Ambulatory Visit (INDEPENDENT_AMBULATORY_CARE_PROVIDER_SITE_OTHER): Admitting: Internal Medicine

## 2024-02-23 VITALS — BP 120/64 | HR 85 | Temp 98.0°F | Ht 73.0 in | Wt 194.4 lb

## 2024-02-23 DIAGNOSIS — G8191 Hemiplegia, unspecified affecting right dominant side: Secondary | ICD-10-CM

## 2024-02-23 DIAGNOSIS — Z9079 Acquired absence of other genital organ(s): Secondary | ICD-10-CM | POA: Diagnosis not present

## 2024-02-23 DIAGNOSIS — Z8546 Personal history of malignant neoplasm of prostate: Secondary | ICD-10-CM | POA: Diagnosis not present

## 2024-02-23 DIAGNOSIS — J320 Chronic maxillary sinusitis: Secondary | ICD-10-CM

## 2024-02-23 DIAGNOSIS — Z860100 Personal history of colon polyps, unspecified: Secondary | ICD-10-CM

## 2024-02-23 DIAGNOSIS — E663 Overweight: Secondary | ICD-10-CM | POA: Insufficient documentation

## 2024-02-23 DIAGNOSIS — Z0000 Encounter for general adult medical examination without abnormal findings: Secondary | ICD-10-CM

## 2024-02-23 DIAGNOSIS — Z79899 Other long term (current) drug therapy: Secondary | ICD-10-CM

## 2024-02-23 DIAGNOSIS — N529 Male erectile dysfunction, unspecified: Secondary | ICD-10-CM | POA: Insufficient documentation

## 2024-02-23 DIAGNOSIS — R2689 Other abnormalities of gait and mobility: Secondary | ICD-10-CM

## 2024-02-23 DIAGNOSIS — F325 Major depressive disorder, single episode, in full remission: Secondary | ICD-10-CM

## 2024-02-23 DIAGNOSIS — E785 Hyperlipidemia, unspecified: Secondary | ICD-10-CM | POA: Diagnosis not present

## 2024-02-23 DIAGNOSIS — S069X4S Unspecified intracranial injury with loss of consciousness of 6 hours to 24 hours, sequela: Secondary | ICD-10-CM

## 2024-02-23 DIAGNOSIS — I10 Essential (primary) hypertension: Secondary | ICD-10-CM | POA: Diagnosis not present

## 2024-02-23 DIAGNOSIS — S069XAS Unspecified intracranial injury with loss of consciousness status unknown, sequela: Secondary | ICD-10-CM

## 2024-02-23 DIAGNOSIS — Z0001 Encounter for general adult medical examination with abnormal findings: Secondary | ICD-10-CM

## 2024-02-23 DIAGNOSIS — Z87891 Personal history of nicotine dependence: Secondary | ICD-10-CM

## 2024-02-23 DIAGNOSIS — R413 Other amnesia: Secondary | ICD-10-CM

## 2024-02-23 LAB — COMPREHENSIVE METABOLIC PANEL WITH GFR
ALT: 38 U/L (ref 0–53)
AST: 23 U/L (ref 0–37)
Albumin: 4.4 g/dL (ref 3.5–5.2)
Alkaline Phosphatase: 71 U/L (ref 39–117)
BUN: 15 mg/dL (ref 6–23)
CO2: 26 meq/L (ref 19–32)
Calcium: 9.4 mg/dL (ref 8.4–10.5)
Chloride: 105 meq/L (ref 96–112)
Creatinine, Ser: 0.81 mg/dL (ref 0.40–1.50)
GFR: 89.95 mL/min (ref >60.00)
Glucose, Bld: 115 mg/dL — ABNORMAL HIGH (ref 70–99)
Potassium: 4.4 meq/L (ref 3.5–5.1)
Sodium: 139 meq/L (ref 135–145)
Total Bilirubin: 0.4 mg/dL (ref 0.2–1.2)
Total Protein: 7 g/dL (ref 6.0–8.3)

## 2024-02-23 LAB — LIPID PANEL
Cholesterol: 120 mg/dL (ref 0–200)
HDL: 52.9 mg/dL (ref >39.00)
LDL Cholesterol: 41 mg/dL (ref 0–99)
NonHDL: 67.46
Total CHOL/HDL Ratio: 2
Triglycerides: 132 mg/dL (ref 0.0–149.0)
VLDL: 26.4 mg/dL (ref 0.0–40.0)

## 2024-02-23 MED ORDER — AMLODIPINE BESYLATE 5 MG PO TABS: 5.0000 mg | ORAL_TABLET | Freq: Every day | ORAL | 4 refills | Status: AC

## 2024-02-23 MED ORDER — MULTI-VITAMIN/MINERALS PO TABS: 1.0000 | ORAL_TABLET | Freq: Every day | ORAL | 4 refills | Status: AC

## 2024-02-23 MED ORDER — DESIPRAMINE HCL 50 MG PO TABS: 50.0000 mg | ORAL_TABLET | Freq: Every day | ORAL | 4 refills | Status: DC

## 2024-02-23 MED ORDER — AMOXICILLIN-POT CLAVULANATE 875-125 MG PO TABS: 1.0000 | ORAL_TABLET | Freq: Two times a day (BID) | ORAL | 0 refills | Status: AC

## 2024-02-23 MED ORDER — ROSUVASTATIN CALCIUM 20 MG PO TABS
20.0000 mg | ORAL_TABLET | Freq: Every day | ORAL | 4 refills | Status: AC
Start: 2024-02-23 — End: ?

## 2024-02-23 MED ORDER — PROPRANOLOL HCL 80 MG PO TABS: 80.0000 mg | ORAL_TABLET | Freq: Every day | ORAL | 4 refills | Status: AC
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
## 2024-02-23 NOTE — Assessment & Plan Note (Signed)
 Mild, his insurance would not cover Hemoglobin A1c check or this or hypertension without a waiver, although both diagnoses represent a reasonable basis to check- this was not ordered and we will depend on fasting glucose

## 2024-02-23 NOTE — Assessment & Plan Note (Signed)
 Reviewed available data from patient and  BP Readings from Last 3 Encounters:  02/23/24 120/64  04/04/23 95/61  01/30/23 117/77   My individualized, goal average blood pressure for this patient, after considering the evidence for and against aggressive blood pressure goals as well as their past medical history and preferences, is 140/90 In my medical opinion, this problem is stable, very well controlled  Will continue the current medication(s), unchanged:  Current hypertension medications:       Sig   amLODipine  (NORVASC ) 5 MG tablet Take 1 tablet (5 mg total) by mouth daily.   propranolol  (INDERAL ) 80 MG tablet Take 1 tablet (80 mg total) by mouth daily at 6 (six) AM.      This was decided by careful and shared decision making based on collaborative review of their situation and the associated data.     I'm a little concerned about the high dose propranolol  once daily, but he denies any syncope in years now so will leave it alone.

## 2024-02-23 NOTE — Assessment & Plan Note (Signed)
 Medications: not yet discussed Lab Results  Component Value Date   HDL 56.80 11/18/2022   CHOLHDL 2 11/18/2022   Lab Results  Component Value Date   LDLCALC 58 11/18/2022   Lab Results  Component Value Date   TRIG 108.0 11/18/2022   Lab Results  Component Value Date   CHOL 137 11/18/2022   The 10-year ASCVD risk score (Arnett DK, et al., 2019) is: 13.6%   Values used to calculate the score:     Age: 68 years     Sex: Male     Is Non-Hispanic African American: No     Diabetic: No     Tobacco smoker: No     Systolic Blood Pressure: 120 mmHg     Is BP treated: Yes     HDL Cholesterol: 56.8 mg/dL     Total Cholesterol: 137 mg/dL Lab Results  Component Value Date   ALT 29 11/18/2022   AST 23 11/18/2022   ALKPHOS 60 11/18/2022   Recheck lipid, continue(s) with current dose statin at goal of Low Density Lipoprotein (LDL cholesterol).
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
## 2024-02-23 NOTE — Assessment & Plan Note (Signed)
 As a former smoker, he is at risk for an aortic aneurysm. Recommend a screening ultrasound to detect any aortic dilation, which could lead to sudden death if not addressed. Insurance will cover this one-time lifetime screening.

## 2024-02-23 NOTE — Assessment & Plan Note (Signed)
 Borderline prostate cancer was treated with surgery. He has second thoughts about the decision but is reassured about not dying from prostate cancer. Order a PSA test as part of blood work.

## 2024-02-23 NOTE — Assessment & Plan Note (Signed)
 Recent colonoscopy reviewed.
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
## 2024-02-23 NOTE — Assessment & Plan Note (Signed)
    02/23/2024    9:12 AM 02/17/2023    9:37 AM 11/18/2022   10:23 AM  Depression screen PHQ 2/9  Decreased Interest 0 0 0  Down, Depressed, Hopeless 0 0 1  PHQ - 2 Score 0 0 1  Altered sleeping 0  0  Tired, decreased energy 0  0  Change in appetite 0  0  Feeling bad or failure about yourself  0  0  Trouble concentrating 0  1  Moving slowly or fidgety/restless 0  0  Suicidal thoughts 0  0  PHQ-9 Score 0  2  Difficult doing work/chores Not difficult at all  Not difficult at all

## 2024-02-23 NOTE — Assessment & Plan Note (Signed)
 Reassured - I think he made a good decision although he is troubled be loss of sexual function.

## 2024-02-24 ENCOUNTER — Ambulatory Visit: Payer: Self-pay | Admitting: Internal Medicine

## 2024-02-24 ENCOUNTER — Ambulatory Visit
Admission: RE | Admit: 2024-02-24 | Discharge: 2024-02-24 | Disposition: A | Discharge status: Home / Self Care | Source: Ambulatory Visit | Attending: Internal Medicine | Admitting: Internal Medicine

## 2024-02-24 ENCOUNTER — Encounter (INDEPENDENT_AMBULATORY_CARE_PROVIDER_SITE_OTHER): Payer: Self-pay

## 2024-02-24 DIAGNOSIS — Z87891 Personal history of nicotine dependence: Secondary | ICD-10-CM

## 2024-02-24 LAB — TSH RFX ON ABNORMAL TO FREE T4: TSH: 2.3 u[IU]/mL (ref 0.450–4.500)

## 2024-02-24 LAB — PSA: PSA: 0.02 ng/mL — ABNORMAL LOW (ref 0.10–4.00)

## 2024-07-21 ENCOUNTER — Encounter (INDEPENDENT_AMBULATORY_CARE_PROVIDER_SITE_OTHER): Payer: Self-pay | Admitting: Gastroenterology

## 2024-10-06 ENCOUNTER — Other Ambulatory Visit: Payer: Self-pay | Admitting: Internal Medicine

## 2024-10-06 DIAGNOSIS — F325 Major depressive disorder, single episode, in full remission: Secondary | ICD-10-CM

## 2025-02-23 ENCOUNTER — Ambulatory Visit: Admitting: Internal Medicine
